# Patient Record
Sex: Female | Born: 1960 | Race: White | Hispanic: No | Marital: Single | Smoking: Former smoker
Health system: Southern US, Community
[De-identification: ages and names within clinical notes are randomized; demographics above are authoritative.]

## PROBLEM LIST (undated history)

## (undated) DIAGNOSIS — K5790 Diverticulosis of intestine, part unspecified, without perforation or abscess without bleeding: Secondary | ICD-10-CM

## (undated) DIAGNOSIS — K649 Unspecified hemorrhoids: Secondary | ICD-10-CM

## (undated) DIAGNOSIS — E785 Hyperlipidemia, unspecified: Secondary | ICD-10-CM

## (undated) DIAGNOSIS — E119 Type 2 diabetes mellitus without complications: Secondary | ICD-10-CM

## (undated) DIAGNOSIS — M1990 Unspecified osteoarthritis, unspecified site: Secondary | ICD-10-CM

## (undated) DIAGNOSIS — E559 Vitamin D deficiency, unspecified: Secondary | ICD-10-CM

## (undated) DIAGNOSIS — K219 Gastro-esophageal reflux disease without esophagitis: Secondary | ICD-10-CM

## (undated) DIAGNOSIS — T7840XA Allergy, unspecified, initial encounter: Secondary | ICD-10-CM

## (undated) HISTORY — DX: Diverticulosis of intestine, part unspecified, without perforation or abscess without bleeding: K57.90

## (undated) HISTORY — DX: Allergy, unspecified, initial encounter: T78.40XA

## (undated) HISTORY — DX: Type 2 diabetes mellitus without complications: E11.9

## (undated) HISTORY — DX: Unspecified osteoarthritis, unspecified site: M19.90

## (undated) HISTORY — DX: Vitamin D deficiency, unspecified: E55.9

## (undated) HISTORY — DX: Gastro-esophageal reflux disease without esophagitis: K21.9

## (undated) HISTORY — PX: NASAL SEPTUM SURGERY: SHX37

## (undated) HISTORY — DX: Unspecified hemorrhoids: K64.9

## (undated) HISTORY — PX: CARPAL TUNNEL RELEASE: SHX101

## (undated) HISTORY — DX: Hyperlipidemia, unspecified: E78.5

---

## 1999-07-07 ENCOUNTER — Other Ambulatory Visit: Admission: RE | Admit: 1999-07-07 | Discharge: 1999-07-07 | Payer: Self-pay | Admitting: Gynecology

## 2000-07-19 ENCOUNTER — Other Ambulatory Visit: Admission: RE | Admit: 2000-07-19 | Discharge: 2000-07-19 | Payer: Self-pay | Admitting: Gynecology

## 2000-11-29 ENCOUNTER — Encounter (INDEPENDENT_AMBULATORY_CARE_PROVIDER_SITE_OTHER): Payer: Self-pay | Admitting: Specialist

## 2000-11-29 ENCOUNTER — Ambulatory Visit (HOSPITAL_BASED_OUTPATIENT_CLINIC_OR_DEPARTMENT_OTHER): Admission: RE | Admit: 2000-11-29 | Discharge: 2000-11-29 | Payer: Self-pay | Admitting: *Deleted

## 2001-07-02 ENCOUNTER — Other Ambulatory Visit: Admission: RE | Admit: 2001-07-02 | Discharge: 2001-07-02 | Payer: Self-pay | Admitting: Gynecology

## 2002-07-09 ENCOUNTER — Other Ambulatory Visit: Admission: RE | Admit: 2002-07-09 | Discharge: 2002-07-09 | Payer: Self-pay | Admitting: Gynecology

## 2003-07-03 ENCOUNTER — Other Ambulatory Visit: Admission: RE | Admit: 2003-07-03 | Discharge: 2003-07-03 | Payer: Self-pay | Admitting: Gynecology

## 2003-08-28 ENCOUNTER — Encounter: Admission: RE | Admit: 2003-08-28 | Discharge: 2003-08-28 | Payer: Self-pay | Admitting: Allergy and Immunology

## 2004-07-04 ENCOUNTER — Other Ambulatory Visit: Admission: RE | Admit: 2004-07-04 | Discharge: 2004-07-04 | Payer: Self-pay | Admitting: Gynecology

## 2005-07-04 ENCOUNTER — Other Ambulatory Visit: Admission: RE | Admit: 2005-07-04 | Discharge: 2005-07-04 | Payer: Self-pay | Admitting: Gynecology

## 2006-06-18 ENCOUNTER — Other Ambulatory Visit: Admission: RE | Admit: 2006-06-18 | Discharge: 2006-06-18 | Payer: Self-pay | Admitting: Gynecology

## 2006-12-18 ENCOUNTER — Ambulatory Visit (HOSPITAL_COMMUNITY): Admission: RE | Admit: 2006-12-18 | Discharge: 2006-12-18 | Payer: Self-pay | Admitting: Internal Medicine

## 2007-07-02 ENCOUNTER — Other Ambulatory Visit: Admission: RE | Admit: 2007-07-02 | Discharge: 2007-07-02 | Payer: Self-pay | Admitting: Internal Medicine

## 2008-02-05 ENCOUNTER — Ambulatory Visit (HOSPITAL_COMMUNITY): Admission: RE | Admit: 2008-02-05 | Discharge: 2008-02-05 | Payer: Self-pay | Admitting: Internal Medicine

## 2008-07-01 ENCOUNTER — Other Ambulatory Visit: Admission: RE | Admit: 2008-07-01 | Discharge: 2008-07-01 | Payer: Self-pay | Admitting: Internal Medicine

## 2009-04-01 ENCOUNTER — Ambulatory Visit (HOSPITAL_COMMUNITY): Admission: RE | Admit: 2009-04-01 | Discharge: 2009-04-01 | Payer: Self-pay | Admitting: Internal Medicine

## 2009-05-04 ENCOUNTER — Observation Stay (HOSPITAL_COMMUNITY): Admission: EM | Admit: 2009-05-04 | Discharge: 2009-05-05 | Payer: Self-pay | Admitting: Emergency Medicine

## 2009-06-07 ENCOUNTER — Encounter: Admission: RE | Admit: 2009-06-07 | Discharge: 2009-06-07 | Payer: Self-pay | Admitting: Orthopedic Surgery

## 2010-11-03 LAB — COMPREHENSIVE METABOLIC PANEL
ALT: 23 U/L (ref 0–35)
AST: 39 U/L — ABNORMAL HIGH (ref 0–37)
Calcium: 9 mg/dL (ref 8.4–10.5)
GFR calc Af Amer: >60 mL/min (ref >60)
Sodium: 136 mEq/L (ref 135–145)
Total Protein: 7 g/dL (ref 6.0–8.3)

## 2010-11-03 LAB — URINALYSIS, ROUTINE W REFLEX MICROSCOPIC
Bilirubin Urine: NEGATIVE
Nitrite: NEGATIVE
Specific Gravity, Urine: 1.028 (ref 1.005–1.030)
pH: 6 (ref 5.0–8.0)

## 2010-11-03 LAB — CULTURE, BLOOD (ROUTINE X 2): Culture: NO GROWTH

## 2010-11-03 LAB — PREGNANCY, URINE: Preg Test, Ur: NEGATIVE

## 2010-11-03 LAB — WOUND CULTURE
Culture: NO GROWTH
Gram Stain: NONE SEEN

## 2010-11-03 LAB — DIFFERENTIAL
Eosinophils Absolute: 0.1 10*3/uL (ref 0.0–0.7)
Eosinophils Relative: 1 % (ref 0–5)
Lymphs Abs: 1.7 10*3/uL (ref 0.7–4.0)
Monocytes Relative: 7 % (ref 3–12)
Neutrophils Relative %: 68 % (ref 43–77)

## 2010-11-03 LAB — CBC
MCHC: 33.7 g/dL (ref 30.0–36.0)
Platelets: 273 10*3/uL (ref 150–400)
RDW: 13.3 % (ref 11.5–15.5)

## 2010-12-16 NOTE — Op Note (Signed)
[place]. [facility]  Patient:    Carla Morton, Carla Morton                           MRN: [id number] Proc. Date: 11/29/00 Adm. Date:  [id number] Attending:  Claudina Lick                           Operative Report  PREOPERATIVE DIAGNOSIS: 1. Traumatic deviated nasal septum. 2. Nasal turbinate hypertrophy.  POSTOPERATIVE DIAGNOSIS: 1. Traumatic deviated nasal septum. 2. Nasal turbinate hypertrophy.  OPERATION PERFORMED: 1. Nasal septoplasty. 2. Submucous resection of right inferior nasal turbinate.  SURGEON:  Robert L. Lyman Bishop, M.D.  ASSISTANT:  General.  ANESTHESIA:  INDICATIONS FOR PROCEDURE:  This 50 year old white female has had a several year history of chronic nasal obstruction, particularly on the left side with a history of previous nasal trauma.  Examination showed a markedly displaced nasal septum to the left with the caudal deflection into the left naris as well.  Compensatory enlargement of the right inferior and right middle turbinates.  CT scan of sinuses within normal limits.  Patient admitted for surgery.  DESCRIPTION OF PROCEDURE:  After satisfactory general endotracheal anesthesia had been induced, topical epinephrine packs were placed intranasally, after which the nasal septum and both inferior nasal turbinates were infiltrated with 1% Xylocaine containing 1:100,000 epinephrine for hemostasis. The nose and face were prepped with Betadine and sterile drapes applied.  There was a marked displacement of the caudal septum into the left naris.  There was then a spur from the right side of the maxillary crest but the bulk of the cartilaginous septum and bony septum was markedly displaced to the left of the maxillary crest and there was enlargement of the right middle and inferior nasal turbinates.  The left anterior septal incision was made and the mucoperichondrium and periosteum elevated on the left side.  Dissection was then  carried around the caudal margin of the septum and the soft tissue flaps were elevated on the right side.  There were multiple fracture lines through the caudal one half of the cartilaginous septum.  These were partially separated.  There was some overlapping of the cartilage and the cartilage was then dissected up from the maxillary crest and the spur was removed from the right side with a chisel.  The cartilage was then partially separated from the bony septum and I found that the perpendicular plate was markedly displaced to the left and there was a broad adhesion between the superior left nasal septum, middle turbinate extending all the way back to the anterior face of the sphenoid.  This was separated with sharp dissection.  The deviated portion of the perpendicular plate was removed.  Part of it I was able to fracture back to the midline.  The posterior septal flaps were then reapproximated with mattress suture of 4-0 plain gut.  The cartilaginous septum was reconstructed by realigning the several pieces of septal cartilage and stabilizing them with sutures placed in the [facility] technique using 4-0 plain gut.  The cartilage was also repositioned and stabilized in the midline over the maxillary crest with mattress sutures of 5-0 Vicryl placed in the [facility] technique.  A pocket was made in the membranous columella and into this was placed the caudal margin of the septum.  The incision was then closed with running 5-0 chromic cut and the caudal margin of the septum was maintained  in the midline with a mattress suture of 4-0 plain gut.  To prevent readhesion on the left side, a large thin piece of Silastic sheeting was sutured to the left side of the nasal septum extending to the root and back to the anterior face of the sphenoid sinus.  A small incision was made over the anterior end of the right inferior nasal turbinate.  The mucoperiosteum elevated.  Excess turbinate and bone  were removed and the incision closed with interrupted 5-0 chromic gut.  The right middle turbinate was crushed and lateralized as was the left anterior nasal turbinated.  A folded strip of Telfa gauze coated with Cortisporin ointment was then placed in each side of the nasal cavity.  Estimated blood loss was 5 to 10 cc.  The patient tolerated the procedure well and was awakened from anesthesia and taken to the recovery room in satisfactory condition. DD:  11/29/00 TD:  11/30/00 Job: [id number]

## 2011-07-01 HISTORY — PX: COLONOSCOPY: SHX174

## 2011-11-05 LAB — HM PAP SMEAR: HM Pap smear: NORMAL

## 2011-11-23 ENCOUNTER — Other Ambulatory Visit: Payer: Self-pay | Admitting: Gynecology

## 2011-11-23 DIAGNOSIS — R928 Other abnormal and inconclusive findings on diagnostic imaging of breast: Secondary | ICD-10-CM

## 2011-11-28 ENCOUNTER — Ambulatory Visit
Admission: RE | Admit: 2011-11-28 | Discharge: 2011-11-28 | Disposition: A | Discharge status: Home / Self Care | Payer: BC Managed Care – PPO | Source: Ambulatory Visit | Attending: Gynecology | Admitting: Gynecology

## 2011-11-28 DIAGNOSIS — R928 Other abnormal and inconclusive findings on diagnostic imaging of breast: Secondary | ICD-10-CM

## 2011-12-21 ENCOUNTER — Encounter (HOSPITAL_COMMUNITY): Payer: Self-pay | Admitting: Family Medicine

## 2011-12-21 ENCOUNTER — Emergency Department (HOSPITAL_COMMUNITY)
Admission: EM | Admit: 2011-12-21 | Discharge: 2011-12-21 | Disposition: A | Discharge status: Home / Self Care | Payer: BC Managed Care – PPO | Attending: Emergency Medicine | Admitting: Emergency Medicine

## 2011-12-21 DIAGNOSIS — J45909 Unspecified asthma, uncomplicated: Secondary | ICD-10-CM | POA: Insufficient documentation

## 2011-12-21 DIAGNOSIS — M79609 Pain in unspecified limb: Secondary | ICD-10-CM | POA: Insufficient documentation

## 2011-12-21 DIAGNOSIS — S61209A Unspecified open wound of unspecified finger without damage to nail, initial encounter: Secondary | ICD-10-CM | POA: Insufficient documentation

## 2011-12-21 DIAGNOSIS — W268XXA Contact with other sharp object(s), not elsewhere classified, initial encounter: Secondary | ICD-10-CM | POA: Insufficient documentation

## 2011-12-21 DIAGNOSIS — Z79899 Other long term (current) drug therapy: Secondary | ICD-10-CM | POA: Insufficient documentation

## 2011-12-21 DIAGNOSIS — R209 Unspecified disturbances of skin sensation: Secondary | ICD-10-CM | POA: Insufficient documentation

## 2011-12-21 MED ORDER — LIDOCAINE HCL 1 % IJ SOLN
5.0000 mL | Freq: Once | INTRAMUSCULAR | Status: AC
  Administered 2011-12-21: 5 mL via INTRADERMAL

## 2011-12-21 MED ORDER — HYDROCODONE-ACETAMINOPHEN 5-325 MG PO TABS
1.0000 | ORAL_TABLET | Freq: Once | ORAL | Status: AC
  Administered 2011-12-21: 1 via ORAL
  Filled 2011-12-21: qty 1

## 2011-12-21 MED ORDER — HYDROCODONE-ACETAMINOPHEN 5-325 MG PO TABS: 1.0000 | ORAL_TABLET | Freq: Four times a day (QID) | ORAL | Status: AC | PRN

## 2011-12-21 MED ORDER — TETANUS-DIPHTH-ACELL PERTUSSIS 5-2.5-18.5 LF-MCG/0.5 IM SUSP
0.5000 mL | Freq: Once | INTRAMUSCULAR | Status: AC
  Administered 2011-12-21: 0.5 mL via INTRAMUSCULAR
  Filled 2011-12-21: qty 0.5

## 2011-12-21 NOTE — ED Provider Notes (Signed)
History     CSN: [id number]  Arrival date & time 12/21/11  Barry Brunner   First MD Initiated Contact with Patient 12/21/11 2023      Chief Complaint  Patient presents with  . Extremity Laceration    (Consider location/radiation/quality/duration/timing/severity/associated sxs/prior treatment) HPI The patient presents immediately after suffering a right fifth digit laceration.  She slipped and slid down her right hand against a metal edge.  Since that time she's had pain persistently throughout the right fifth digit.  She also has active bleeding.  She notes tingling in the distal edge of the extremity.  No other injuries, no other complaints. Past Medical History  Diagnosis Date  . Asthma   . Hypotension     Past Surgical History  Procedure Date  . Nasal septum surgery   . Carpal tunnel release     No family history on file.  History  Substance Use Topics  . Smoking status: Never Smoker   . Smokeless tobacco: Not on file  . Alcohol Use: Yes     Occasional on weekend    OB History    Grav Para Term Preterm Abortions TAB SAB Ect Mult Living                  Review of Systems  All other systems reviewed and are negative.    Allergies  Erythromycin  Home Medications   Current Outpatient Rx  Name Route Sig Dispense Refill  . ESTRADIOL-LEVONORGESTREL 0.045-0.015 MG/DAY TD PTWK Transdermal Place 1 patch onto the skin once a week. Swapped out on Wednesdays.    Marland Kitchen FLUTICASONE PROPIONATE 50 MCG/ACT NA SUSP Nasal Place 2 sprays into the nose daily as needed. For allergies.    Marland Kitchen HYDROCODONE-ACETAMINOPHEN 5-325 MG PO TABS Oral Take 1 tablet by mouth every 6 (six) hours as needed for pain. 15 tablet 0    BP 110/65  Pulse 63  Temp(Src) 97.8 F (36.6 C) (Oral)  Resp 14  SpO2 99%  Physical Exam  Nursing note and vitals reviewed. Constitutional: She is oriented to person, place, and time. She appears well-developed and well-nourished. No distress.  HENT:  Head:  Normocephalic and atraumatic.  Eyes: Conjunctivae and EOM are normal.  Cardiovascular: Intact distal pulses.   Pulmonary/Chest: Effort normal. No stridor. No respiratory distress.  Musculoskeletal:       On the right fifth digit there are 3 distinct areas of laceration.  On the distal edge oriented along the inferior Hale Bogus, running approximately half centimeter below the edge of the nail, there is a 3 cm laceration. On the mid phalanx there is a C. shaped flap approximately 2 cm in circumference with attachment at the proximal edge In the web space along the base of the digit there is a 5 cm full dermal depth laceration with soft tissue visible beneath, including visibility of the nerve. Throughout the range of motion of the digit there is no visible deformity of the tendon, nor of the nerve.  The patient can flex and extend both the medial and distal phalanx.  Refill and sensation are appropriate and the distal pad.  Neurological: She is alert and oriented to person, place, and time. No cranial nerve deficit. She exhibits normal muscle tone. Coordination normal.  Skin: Skin is warm and dry.  Psychiatric: She has a normal mood and affect.    ED Course  LACERATION REPAIR Date/Time: 12/21/2011 11:00 PM Performed by: Gerhard Munch Authorized by: Gerhard Munch Consent: Verbal consent obtained. The procedure was  performed in an emergent situation. Risks and benefits: risks, benefits and alternatives were discussed Consent given by: patient Time out: Immediately prior to procedure a "time out" was called to verify the correct patient, procedure, equipment, support staff and site/side marked as required. Body area: upper extremity Location details: right hand Laceration length: 10 cm Tendon involvement: none Nerve involvement: superficial Vascular damage: no Anesthesia: nerve block Local anesthetic: lidocaine 1% without epinephrine Anesthetic total: 3 ml Patient sedated:  no Preparation: Patient was prepped and draped in the usual sterile fashion. Irrigation solution: saline and tap water Irrigation method: syringe and tap Amount of cleaning: standard Debridement: none Degree of undermining: none Skin closure: 6-0 nylon Number of sutures: 10 Technique: simple Approximation: close Approximation difficulty: complex Dressing: gauze roll, 4x4 sterile gauze and tube gauze Patient tolerance: Patient tolerated the procedure well with no immediate complications.   (including critical care time)  Labs Reviewed - No data to display No results found.   1. Laceration of hand with complication       MDM  This generally well female presents with a right hand laceration.  Laceration includes the web space between the fourth and fifth digits as well as to independent lacerations on the fifth digit.  Although the patient's nerve was visible, she had appropriate neurovascular function in the digit, and her wound was repaired as above.  She was discharged in stable condition to follow up with hand in one week.  She also had her tetanus updated.    Gerhard Munch, MD 12/21/11 [id number]

## 2011-12-21 NOTE — ED Notes (Signed)
Pt dc'd.  [place].  Pt friend at side to drive pt home.  Pt verbalizes understanding

## 2011-12-21 NOTE — ED Notes (Addendum)
Patient states that she fell and while breaking her fall cut her right hand on her hot water heater. Laceration noted between 4th and 5th digit, tip of 5th digit. Unable to touch 5th digit to thumb. Bleeding controlled.

## 2012-09-18 ENCOUNTER — Ambulatory Visit (HOSPITAL_COMMUNITY)
Admission: RE | Admit: 2012-09-18 | Discharge: 2012-09-18 | Disposition: A | Discharge status: Home / Self Care | Payer: BC Managed Care – PPO | Source: Ambulatory Visit | Attending: Physician Assistant | Admitting: Physician Assistant

## 2012-09-18 ENCOUNTER — Other Ambulatory Visit (HOSPITAL_COMMUNITY): Payer: Self-pay | Admitting: Physician Assistant

## 2012-09-18 DIAGNOSIS — I10 Essential (primary) hypertension: Secondary | ICD-10-CM | POA: Insufficient documentation

## 2012-09-18 DIAGNOSIS — Z0000 Encounter for general adult medical examination without abnormal findings: Secondary | ICD-10-CM | POA: Insufficient documentation

## 2012-10-23 ENCOUNTER — Telehealth: Payer: Self-pay | Admitting: Internal Medicine

## 2012-10-23 ENCOUNTER — Encounter: Payer: Self-pay | Admitting: Gastroenterology

## 2012-10-23 ENCOUNTER — Ambulatory Visit (INDEPENDENT_AMBULATORY_CARE_PROVIDER_SITE_OTHER): Payer: BC Managed Care – PPO | Admitting: Gastroenterology

## 2012-10-23 VITALS — BP 98/60 | HR 72 | Temp 99.4°F | Ht 67.0 in | Wt 137.6 lb

## 2012-10-23 DIAGNOSIS — R194 Change in bowel habit: Secondary | ICD-10-CM

## 2012-10-23 DIAGNOSIS — R1030 Lower abdominal pain, unspecified: Secondary | ICD-10-CM

## 2012-10-23 DIAGNOSIS — R109 Unspecified abdominal pain: Secondary | ICD-10-CM

## 2012-10-23 DIAGNOSIS — R102 Pelvic and perineal pain: Secondary | ICD-10-CM | POA: Insufficient documentation

## 2012-10-23 DIAGNOSIS — R198 Other specified symptoms and signs involving the digestive system and abdomen: Secondary | ICD-10-CM

## 2012-10-23 MED ORDER — HYOSCYAMINE SULFATE 0.125 MG SL SUBL: 0.1250 mg | SUBLINGUAL_TABLET | SUBLINGUAL | Status: DC | PRN

## 2012-10-23 MED ORDER — METRONIDAZOLE 500 MG PO TABS: 500.0000 mg | ORAL_TABLET | Freq: Three times a day (TID) | ORAL | Status: DC

## 2012-10-23 NOTE — Telephone Encounter (Signed)
Pt states she has been having abdominal pain and been running a low grade temp. Pt saw her PCP and was placed on antibiotics. States the pain has not gotten any better, describes it as lower abdominal pain right above her bladder. Pt also states her stool has changed to a very small amount and she has urgency all the time. Pt scheduled to see Doug Sou PA today at 2:15pm. Pt aware of appt date and time.

## 2012-10-23 NOTE — Patient Instructions (Addendum)
Your physician has requested that you go to the basement for lab work before leaving today  We have sent the following medications to your pharmacy for you to pick up at your convenience: Flagyl and Levsin  Please call back on Friday and speak with Rene Kocher to give her an update on your symptoms

## 2012-10-23 NOTE — Progress Notes (Signed)
10/23/2012 Carla Morton [id number] 1960-12-13   HISTORY OF PRESENT ILLNESS:  Patient is a pleasant 53 year old female who presents to our office today with complaints of lower abdominal pain, low grade fever, and change of bowels, which began about a week ago.  She says that she ate a burger from "[employer]" that looked like it was undercooked.  She says that shortly after she started developing upper abdominal pain that then moved to her lower abdomen.  It is now located in her suprapubic region and is described as a constant discomfort, but gets more severe and like a stabbing pain, especially at night.  She also says that she has had a low grade fever on three occasions, up to 100.8 degrees.  No nausea or vomiting.  Has had mostly formed stools, but with small amounts of liquid stool at times.  No blood in her stool.  Her appetite is quite normal.  She saw her PCP who ordered a urinalysis and is treating her for a UTI with Cipro (started 5 days ago) and a yeast infection with Diflucan (started 2 days ago).  CBC, CMET, amylase, and lipase were unremarkable.  Says that she is still having symptoms despite the treated that she has already started.  Admits that she was on amoxicillin in January.  She had a colonoscopy in 07/2011 in [place] at which time she was found to have mild diverticulosis in the sigmoid colon, internal hemorrhoids, and hypertrophied anal papillae in the anus.   Never had similar symptoms previously.   Past Medical History  Diagnosis Date  . Asthma     as a child  . Hypotension   . Hyperlipidemia   . Diabetes     pre-diabetic  . Vitamin D deficiency   . Hemorrhoids   . Diverticulosis    Past Surgical History  Procedure Laterality Date  . Nasal septum surgery    . Carpal tunnel release Bilateral     reports that she quit smoking about 37 years ago. Her smoking use included Cigarettes. She has a 8 pack-year smoking history. She has never used smokeless tobacco. She  reports that  drinks alcohol. She reports that she uses illicit drugs (Marijuana). family history includes Colon cancer in her maternal grandmother and Other in her father. Allergies  Allergen Reactions  . Erythromycin       Outpatient Encounter Prescriptions as of 10/23/2012  Medication Sig Dispense Refill  . albuterol (PROVENTIL HFA;VENTOLIN HFA) 108 (90 BASE) MCG/ACT inhaler Inhale 2 puffs into the lungs every 4 (four) hours as needed for wheezing.      . B Complex-C (SUPER B COMPLEX PO) Take 1 tablet by mouth daily.      . Calcium Carbonate-Vitamin D (CALCIUM + D PO) Take 1 tablet by mouth daily.      . Cetirizine HCl (ZYRTEC ALLERGY PO) Take 1 tablet by mouth daily.      . Cholecalciferol (VITAMIN D3) 2000 UNITS TABS Take 1 tablet by mouth daily.      . ciprofloxacin (CIPRO) 500 MG tablet Take 500 mg by mouth 2 (two) times daily.      . Estradiol (CLIMARA TD) Place 1 patch onto the skin once a week.      Kathlee [facility]) 0.045-0.015 MG/DAY Place 1 patch onto the skin once a week. Swapped out on Wednesdays.      . fluconazole (DIFLUCAN) 150 MG tablet Take 150 mg by mouth once a week.       Marland Kitchen  fluticasone (FLONASE) 50 MCG/ACT nasal spray Place 2 sprays into the nose daily as needed. For allergies.      . Multiple Vitamins-Minerals (CENTRUM SILVER PO) Take 1 tablet by mouth daily.      . Turmeric 500 MG CAPS Take 1 tablet by mouth daily.       No facility-administered encounter medications on file as of 10/23/2012.     REVIEW OF SYSTEMS  : All other systems reviewed and negative except where noted in the History of Present Illness.   PHYSICAL EXAM: BP 98/60  Pulse 72  Ht 5\' 7"  (1.702 m)  Wt 137 lb 9 oz (62.398 kg)  BMI 21.54 kg/m2 General: Well developed white female in no acute distress Head: Normocephalic and atraumatic Eyes:  sclerae anicteric,conjunctive pink. Ears: Normal auditory acuity Lungs: Clear throughout to auscultation Heart: Regular rate  and rhythm Abdomen: Soft, non-distended. No masses or hepatomegaly noted. Normal bowel sounds.  Mild suprapubic TTP without R/R/G. Musculoskeletal: Symmetrical with no gross deformities  Skin: No lesions on visible extremities Extremities: No edema  Neurological: Alert oriented x 4, grossly nonfocal Psychological:  Alert and cooperative. Normal mood and affect  ASSESSMENT AND PLAN: -Suprapubic abdominal pain with some change in bowels and low-grade fever that began after eating an undercooked burger.  She is also being treated for a UTI as well with Cipro.  She likely has some type of infectious gastroenteritis.  We will check some stool studies for Cdiff, O&P, WBC, and culture for enteric pathogens.  I will add flagyl to her antibiotic regimen, but asked her to collect stools before starting the flagyl (also told her no ETOH while on flagyl).  Will give her a few Levsin to try as well for the abdominal pain.  She will call the office with an update on her condition on Friday.

## 2012-10-23 NOTE — Progress Notes (Signed)
Assessment and plans reviewed. Agree. Shanda Bumps please make sure that this patient gets followup (have I seen her before?)

## 2012-10-24 ENCOUNTER — Other Ambulatory Visit: Payer: BC Managed Care – PPO

## 2012-10-24 DIAGNOSIS — R1030 Lower abdominal pain, unspecified: Secondary | ICD-10-CM

## 2012-10-24 DIAGNOSIS — R194 Change in bowel habit: Secondary | ICD-10-CM

## 2012-10-28 LAB — STOOL CULTURE

## 2012-11-01 ENCOUNTER — Telehealth: Payer: Self-pay | Admitting: Internal Medicine

## 2012-11-01 NOTE — Telephone Encounter (Signed)
Spoke with Dr. Rhea Belton and if pt took her last dose of Flagyl yesterday it is ok for her to have an alcoholic beverage. Pt aware.

## 2012-11-06 ENCOUNTER — Telehealth: Payer: Self-pay

## 2012-11-06 NOTE — Telephone Encounter (Signed)
Left message for pt to call back  °

## 2012-11-06 NOTE — Telephone Encounter (Signed)
Message copied by Chrystie Nose on Wed Nov 06, 2012  3:17 PM ------      Message from: Doug Sou D      Created: Wed Nov 06, 2012  1:39 PM      Regarding: follow-up visit       Bonita Quin,            Would you please contact this patient and see how she is feeling?  If she is still having issues then please give her an appointment with Dr. Marina Goodell for follow-up.            Thank you,            Jess ------

## 2012-11-07 NOTE — Telephone Encounter (Signed)
Spoke with pt and she states she is doing really good now and does not need to be seen.

## 2012-11-07 NOTE — Telephone Encounter (Signed)
Left message for pt to call back  °

## 2013-07-06 ENCOUNTER — Encounter: Payer: Self-pay | Admitting: Physician Assistant

## 2013-07-08 ENCOUNTER — Ambulatory Visit (INDEPENDENT_AMBULATORY_CARE_PROVIDER_SITE_OTHER): Payer: BC Managed Care – PPO | Admitting: Physician Assistant

## 2013-07-08 ENCOUNTER — Encounter: Payer: Self-pay | Admitting: Physician Assistant

## 2013-07-08 VITALS — BP 102/68 | HR 64 | Temp 98.4°F | Resp 16 | Ht 67.5 in | Wt 137.0 lb

## 2013-07-08 DIAGNOSIS — R7309 Other abnormal glucose: Secondary | ICD-10-CM | POA: Insufficient documentation

## 2013-07-08 DIAGNOSIS — E119 Type 2 diabetes mellitus without complications: Secondary | ICD-10-CM

## 2013-07-08 DIAGNOSIS — E785 Hyperlipidemia, unspecified: Secondary | ICD-10-CM | POA: Insufficient documentation

## 2013-07-08 DIAGNOSIS — R7303 Prediabetes: Secondary | ICD-10-CM | POA: Insufficient documentation

## 2013-07-08 DIAGNOSIS — I10 Essential (primary) hypertension: Secondary | ICD-10-CM

## 2013-07-08 DIAGNOSIS — E559 Vitamin D deficiency, unspecified: Secondary | ICD-10-CM | POA: Insufficient documentation

## 2013-07-08 NOTE — Patient Instructions (Signed)

## 2013-07-08 NOTE — Progress Notes (Signed)
HPI Patient presents for a one month follow up. Patient has hot flashes and was given brintellix which caused diarrhea and did not work. She is now on the estrogen patch and states she is doing well, she is not on baby aspirin. Patient states her neck with radicular symptoms down her right arm has improved with posture and stretches. She also has TMJ and states she has been more aware of that and takes baclofen at night occasionally for that and her sleep.    Past Medical History  Diagnosis Date  . Asthma     as a child  . Hypotension   . Hyperlipidemia   . Diabetes     pre-diabetic  . Vitamin D deficiency   . Hemorrhoids   . Diverticulosis   . Allergy   . GERD (gastroesophageal reflux disease)      Allergies  Allergen Reactions  . Erythromycin   . Singulair [Montelukast Sodium]       Current Outpatient Prescriptions on File Prior to Visit  Medication Sig Dispense Refill  . albuterol (PROVENTIL HFA;VENTOLIN HFA) 108 (90 BASE) MCG/ACT inhaler Inhale 2 puffs into the lungs every 4 (four) hours as needed for wheezing.      . B Complex-C (SUPER B COMPLEX PO) Take 1 tablet by mouth daily.      . Calcium Carbonate-Vitamin D (CALCIUM + D PO) Take 1 tablet by mouth daily.      . Cetirizine HCl (ZYRTEC ALLERGY PO) Take 1 tablet by mouth daily.      . Cholecalciferol (VITAMIN D3) 2000 UNITS TABS Take 1 tablet by mouth daily.      Marland Kitchen estradiol-levonorgestrel (CLIMARAPRO) 0.045-0.015 MG/DAY Place 1 patch onto the skin once a week. Swapped out on Wednesdays.      . fluticasone (FLONASE) 50 MCG/ACT nasal spray Place 2 sprays into the nose daily as needed. For allergies.      . Multiple Vitamins-Minerals (CENTRUM SILVER PO) Take 1 tablet by mouth daily.      . Turmeric 500 MG CAPS Take 1 tablet by mouth daily.       No current facility-administered medications on file prior to visit.    ROS: all negative expect above.   Physical: Filed Weights   07/08/13 0854  Weight: 137 lb (62.143 kg)    Filed Vitals:   07/08/13 0854  BP: 102/68  Pulse: 64  Temp: 98.4 F (36.9 C)  Resp: 16   General Appearance: Well nourished, in no apparent distress. Eyes: PERRLA, EOMs. Sinuses: No Frontal/maxillary tenderness ENT/Mouth: Ext aud canals clear, normal light reflex with TMs without erythema, bulging. Post pharynx without erythema, swelling, exudate.  Respiratory: CTAB Cardio: RRR, no murmurs, rubs or gallops. Peripheral pulses brisk and equal bilaterally, without edema. No aortic or femoral bruits. Abdomen: Flat, soft, with bowl sounds. Nontender, no guarding, rebound. Lymphatics: Non tender without lymphadenopathy.  Musculoskeletal: Full ROM all peripheral extremities, 5/5 strength, and normal gait. Skin: Warm, dry without rashes, lesions, ecchymosis.  Neuro: Cranial nerves intact, reflexes equal bilaterally. Normal muscle tone, no cerebellar symptoms. Sensation intact.  Pysch: Awake and oriented X 3, normal affect, Insight and Judgment appropriate.   Assessment and Plan: Hot flashes- continue the estrogen patch Insomnia- continue the melatonin, can try tylenol PM as well and good sleep hygeine discussed.  Follow up at CPE in Feb

## 2013-07-15 ENCOUNTER — Other Ambulatory Visit: Payer: Self-pay | Admitting: Physician Assistant

## 2013-07-15 MED ORDER — FLUCONAZOLE 150 MG PO TABS: 150.0000 mg | ORAL_TABLET | Freq: Every day | ORAL | Status: DC

## 2013-09-18 ENCOUNTER — Ambulatory Visit (INDEPENDENT_AMBULATORY_CARE_PROVIDER_SITE_OTHER): Payer: BC Managed Care – PPO | Admitting: Physician Assistant

## 2013-09-18 ENCOUNTER — Encounter: Payer: Self-pay | Admitting: Physician Assistant

## 2013-09-18 VITALS — BP 102/62 | HR 64 | Temp 98.4°F | Resp 16 | Ht 67.5 in | Wt 144.0 lb

## 2013-09-18 DIAGNOSIS — E559 Vitamin D deficiency, unspecified: Secondary | ICD-10-CM

## 2013-09-18 DIAGNOSIS — R7989 Other specified abnormal findings of blood chemistry: Secondary | ICD-10-CM

## 2013-09-18 DIAGNOSIS — Z79899 Other long term (current) drug therapy: Secondary | ICD-10-CM

## 2013-09-18 DIAGNOSIS — R945 Abnormal results of liver function studies: Secondary | ICD-10-CM

## 2013-09-18 DIAGNOSIS — Z0000 Encounter for general adult medical examination without abnormal findings: Secondary | ICD-10-CM

## 2013-09-18 DIAGNOSIS — Z1159 Encounter for screening for other viral diseases: Secondary | ICD-10-CM

## 2013-09-18 LAB — LIPID PANEL
CHOL/HDL RATIO: 2.8 ratio
Cholesterol: 199 mg/dL (ref 0–200)
HDL: 71 mg/dL (ref >39)
LDL Cholesterol: 107 mg/dL — ABNORMAL HIGH (ref 0–99)
Triglycerides: 105 mg/dL (ref <150)
VLDL: 21 mg/dL (ref 0–40)

## 2013-09-18 LAB — BASIC METABOLIC PANEL WITH GFR
BUN: 12 mg/dL (ref 6–23)
CHLORIDE: 101 meq/L (ref 96–112)
CO2: 28 meq/L (ref 19–32)
CREATININE: 0.6 mg/dL (ref 0.50–1.10)
Calcium: 9.3 mg/dL (ref 8.4–10.5)
GFR, Est African American: >89 mL/min
Glucose, Bld: 75 mg/dL (ref 70–99)
POTASSIUM: 4.4 meq/L (ref 3.5–5.3)
SODIUM: 138 meq/L (ref 135–145)

## 2013-09-18 LAB — URINALYSIS, ROUTINE W REFLEX MICROSCOPIC
Bilirubin Urine: NEGATIVE
Glucose, UA: NEGATIVE mg/dL
Hgb urine dipstick: NEGATIVE
Ketones, ur: NEGATIVE mg/dL
Leukocytes, UA: NEGATIVE
Nitrite: NEGATIVE
Protein, ur: NEGATIVE mg/dL
SPECIFIC GRAVITY, URINE: 1.006 (ref 1.005–1.030)
UROBILINOGEN UA: 0.2 mg/dL (ref 0.0–1.0)
pH: 6.5 (ref 5.0–8.0)

## 2013-09-18 LAB — CBC WITH DIFFERENTIAL/PLATELET
BASOS ABS: 0.1 10*3/uL (ref 0.0–0.1)
Basophils Relative: 1 % (ref 0–1)
EOS PCT: 1 % (ref 0–5)
Eosinophils Absolute: 0.1 10*3/uL (ref 0.0–0.7)
HCT: 38.4 % (ref 36.0–46.0)
Hemoglobin: 12.9 g/dL (ref 12.0–15.0)
LYMPHS ABS: 1.5 10*3/uL (ref 0.7–4.0)
LYMPHS PCT: 26 % (ref 12–46)
MCH: 30.8 pg (ref 26.0–34.0)
MCHC: 33.6 g/dL (ref 30.0–36.0)
MCV: 91.6 fL (ref 78.0–100.0)
Monocytes Absolute: 0.6 10*3/uL (ref 0.1–1.0)
Monocytes Relative: 10 % (ref 3–12)
NEUTROS ABS: 3.5 10*3/uL (ref 1.7–7.7)
NEUTROS PCT: 62 % (ref 43–77)
PLATELETS: 266 10*3/uL (ref 150–400)
RBC: 4.19 MIL/uL (ref 3.87–5.11)
RDW: 13.3 % (ref 11.5–15.5)
WBC: 5.7 10*3/uL (ref 4.0–10.5)

## 2013-09-18 LAB — HEPATIC FUNCTION PANEL
ALBUMIN: 4.7 g/dL (ref 3.5–5.2)
ALK PHOS: 45 U/L (ref 39–117)
ALT: 24 U/L (ref 0–35)
AST: 28 U/L (ref 0–37)
BILIRUBIN DIRECT: 0.1 mg/dL (ref 0.0–0.3)
BILIRUBIN INDIRECT: 0.4 mg/dL (ref 0.2–1.2)
BILIRUBIN TOTAL: 0.5 mg/dL (ref 0.2–1.2)
Total Protein: 7 g/dL (ref 6.0–8.3)

## 2013-09-18 LAB — IRON AND TIBC
%SAT: 28 % (ref 20–55)
Iron: 100 ug/dL (ref 42–145)
TIBC: 352 ug/dL (ref 250–470)
UIBC: 252 ug/dL (ref 125–400)

## 2013-09-18 LAB — VITAMIN B12: Vitamin B-12: 943 pg/mL — ABNORMAL HIGH (ref 211–911)

## 2013-09-18 LAB — MAGNESIUM: MAGNESIUM: 2 mg/dL (ref 1.5–2.5)

## 2013-09-18 LAB — MICROALBUMIN / CREATININE URINE RATIO
CREATININE, URINE: 21.6 mg/dL
MICROALB/CREAT RATIO: 23.1 mg/g (ref 0.0–30.0)
Microalb, Ur: 0.5 mg/dL (ref 0.00–1.89)

## 2013-09-18 LAB — HEPATITIS B SURFACE ANTIBODY,QUALITATIVE: Hep B S Ab: NEGATIVE

## 2013-09-18 LAB — HEMOGLOBIN A1C
HEMOGLOBIN A1C: 5.6 % (ref <5.7)
Mean Plasma Glucose: 114 mg/dL (ref <117)

## 2013-09-18 LAB — HIV ANTIBODY (ROUTINE TESTING W REFLEX): HIV: NONREACTIVE

## 2013-09-18 LAB — HEPATITIS C ANTIBODY: HCV AB: NEGATIVE

## 2013-09-18 LAB — HEPATITIS A ANTIBODY, TOTAL: Hep A Total Ab: NONREACTIVE

## 2013-09-18 LAB — RPR

## 2013-09-18 LAB — FERRITIN: Ferritin: 35 ng/mL (ref 10–291)

## 2013-09-18 LAB — HEPATITIS B CORE ANTIBODY, TOTAL: Hep B Core Total Ab: NONREACTIVE

## 2013-09-18 LAB — TSH: TSH: 0.965 u[IU]/mL (ref 0.350–4.500)

## 2013-09-18 NOTE — Patient Instructions (Signed)
Preventative Care for Adults - Female      MAINTAIN REGULAR HEALTH EXAMS:  A routine yearly physical is a good way to check in with your primary care provider about your health and preventive screening. It is also an opportunity to share updates about your health and any concerns you have, and receive a thorough all-over exam.   Most health insurance companies pay for at least some preventative services.  Check with your health plan for specific coverages.  WHAT PREVENTATIVE SERVICES DO WOMEN NEED?  Adult women should have their weight and blood pressure checked regularly.   Women age 35 and older should have their cholesterol levels checked regularly.  Women should be screened for cervical cancer with a Pap smear and pelvic exam beginning at either age 21, or 3 years after they become sexually activity.    Breast cancer screening generally begins at age 40 with a mammogram and breast exam by your primary care provider.    Beginning at age 50 and continuing to age 75, women should be screened for colorectal cancer.  Certain people may need continued testing until age 85.  Updating vaccinations is part of preventative care.  Vaccinations help protect against diseases such as the flu.  Osteoporosis is a disease in which the bones lose minerals and strength as we age. Women ages 65 and over should discuss this with their caregivers, as should women after menopause who have other risk factors.  Lab tests are generally done as part of preventative care to screen for anemia and blood disorders, to screen for problems with the kidneys and liver, to screen for bladder problems, to check blood sugar, and to check your cholesterol level.  Preventative services generally include counseling about diet, exercise, avoiding tobacco, drugs, excessive alcohol consumption, and sexually transmitted infections.    GENERAL RECOMMENDATIONS FOR GOOD HEALTH:  Healthy diet:  Eat a variety of foods, including  fruit, vegetables, animal or vegetable protein, such as meat, fish, chicken, and eggs, or beans, lentils, tofu, and grains, such as rice.  Drink plenty of water daily.  Decrease saturated fat in the diet, avoid lots of red meat, processed foods, sweets, fast foods, and fried foods.  Exercise:  Aerobic exercise helps maintain good heart health. At least 30-40 minutes of moderate-intensity exercise is recommended. For example, a brisk walk that increases your heart rate and breathing. This should be done on most days of the week.   Find a type of exercise or a variety of exercises that you enjoy so that it becomes a part of your daily life.  Examples are running, walking, swimming, water aerobics, and biking.  For motivation and support, explore group exercise such as aerobic class, spin class, Zumba, Yoga,or  martial arts, etc.    Set exercise goals for yourself, such as a certain weight goal, walk or run in a race such as a 5k walk/run.  Speak to your primary care provider about exercise goals.  Disease prevention:  If you smoke or chew tobacco, find out from your caregiver how to quit. It can literally save your life, no matter how long you have been a tobacco user. If you do not use tobacco, never begin.   Maintain a healthy diet and normal weight. Increased weight leads to problems with blood pressure and diabetes.   The Body Mass Index or BMI is a way of measuring how much of your body is fat. Having a BMI above 27 increases the risk of heart disease,   diabetes, hypertension, stroke and other problems related to obesity. Your caregiver can help determine your BMI and based on it develop an exercise and dietary program to help you achieve or maintain this important measurement at a healthful level.  High blood pressure causes heart and blood vessel problems.  Persistent high blood pressure should be treated with medicine if weight loss and exercise do not work.   Fat and cholesterol leaves  deposits in your arteries that can block them. This causes heart disease and vessel disease elsewhere in your body.  If your cholesterol is found to be high, or if you have heart disease or certain other medical conditions, then you may need to have your cholesterol monitored frequently and be treated with medication.   Ask if you should have a cardiac stress test if your history suggests this. A stress test is a test done on a treadmill that looks for heart disease. This test can find disease prior to there being a problem.  Menopause can be associated with physical symptoms and risks. Hormone replacement therapy is available to decrease these. You should talk to your caregiver about whether starting or continuing to take hormones is right for you.   Osteoporosis is a disease in which the bones lose minerals and strength as we age. This can result in serious bone fractures. Risk of osteoporosis can be identified using a bone density scan. Women ages 65 and over should discuss this with their caregivers, as should women after menopause who have other risk factors. Ask your caregiver whether you should be taking a calcium supplement and Vitamin D, to reduce the rate of osteoporosis.   Avoid drinking alcohol in excess (more than two drinks per day).  Avoid use of street drugs. Do not share needles with anyone. Ask for professional help if you need assistance or instructions on stopping the use of alcohol, cigarettes, and/or drugs.  Brush your teeth twice a day with fluoride toothpaste, and floss once a day. Good oral hygiene prevents tooth decay and gum disease. The problems can be painful, unattractive, and can cause other health problems. Visit your dentist for a routine oral and dental check up and preventive care every 6-12 months.   Look at your skin regularly.  Use a mirror to look at your back. Notify your caregivers of changes in moles, especially if there are changes in shapes, colors, a size  larger than a pencil eraser, an irregular border, or development of new moles.  Safety:  Use seatbelts 100% of the time, whether driving or as a passenger.  Use safety devices such as hearing protection if you work in environments with loud noise or significant background noise.  Use safety glasses when doing any work that could send debris in to the eyes.  Use a helmet if you ride a bike or motorcycle.  Use appropriate safety gear for contact sports.  Talk to your caregiver about gun safety.  Use sunscreen with a SPF (or skin protection factor) of 15 or greater.  Lighter skinned people are at a greater risk of skin cancer. Don't forget to also wear sunglasses in order to protect your eyes from too much damaging sunlight. Damaging sunlight can accelerate cataract formation.   Practice safe sex. Use condoms. Condoms are used for birth control and to help reduce the spread of sexually transmitted infections (or STIs).  Some of the STIs are gonorrhea (the clap), chlamydia, syphilis, trichomonas, herpes, HPV (human papilloma virus) and HIV (human immunodeficiency virus)   which causes AIDS. The herpes, HIV and HPV are viral illnesses that have no cure. These can result in disability, cancer and death.   Keep carbon monoxide and smoke detectors in your home functioning at all times. Change the batteries every 6 months or use a model that plugs into the wall.   Vaccinations:  Stay up to date with your tetanus shots and other required immunizations. You should have a booster for tetanus every 10 years. Be sure to get your flu shot every year, since 5%-20% of the [place] population comes down with the flu. The flu vaccine changes each year, so being vaccinated once is not enough. Get your shot in the fall, before the flu season peaks.   Other vaccines to consider:  Human Papilloma Virus or HPV causes cancer of the cervix, and other infections that can be transmitted from person to person. There is a vaccine for  HPV, and females should get immunized between the ages of 4111 and 7526. It requires a series of 3 shots.   Pneumococcal vaccine to protect against certain types of pneumonia.  This is normally recommended for adults age 54 or older.  However, adults younger than 54 years old with certain underlying conditions such as diabetes, heart or lung disease should also receive the vaccine.  Shingles vaccine to protect against Varicella Zoster if you are older than age 54, or younger than 54 years old with certain underlying illness.  Hepatitis A vaccine to protect against a form of infection of the liver by a virus acquired from food.  Hepatitis B vaccine to protect against a form of infection of the liver by a virus acquired from blood or body fluids, particularly if you work in health care.  If you plan to travel internationally, check with your local health department for specific vaccination recommendations.  Cancer Screening:  Breast cancer screening is essential to preventive care for women. All women age 54 and older should perform a breast self-exam every month. At age 54 and older, women should have their caregiver complete a breast exam each year. Women at ages 6540 and older should have a mammogram (x-ray film) of the breasts. Your caregiver can discuss how often you need mammograms.    Cervical cancer screening includes taking a Pap smear (sample of cells examined under a microscope) from the cervix (end of the uterus). It also includes testing for HPV (Human Papilloma Virus, which can cause cervical cancer). Screening and a pelvic exam should begin at age 54, or 3 years after a woman becomes sexually active. Screening should occur every year, with a Pap smear but no HPV testing, up to age 54. After age 54, you should have a Pap smear every 3 years with HPV testing, if no HPV was found previously.   Most routine colon cancer screening begins at the age of 54. On a yearly basis, doctors may provide  special easy to use take-home tests to check for hidden blood in the stool. Sigmoidoscopy or colonoscopy can detect the earliest forms of colon cancer and is life saving. These tests use a small camera at the end of a tube to directly examine the colon. Speak to your caregiver about this at age 54, when routine screening begins (and is repeated every 5 years unless early forms of pre-cancerous polyps or small growths are found).    Candida Infection, Adult A candida infection (also called yeast, fungus and Monilia infection) is an overgrowth of yeast that can occur anywhere  on the body. A yeast infection commonly occurs in warm, moist body areas. Usually, the infection remains localized but can spread to become a systemic infection. A yeast infection may be a sign of a more severe disease such as diabetes, leukemia, or AIDS. A yeast infection can occur in both men and women. In women, Candida vaginitis is a vaginal infection. It is one of the most common causes of vaginitis. Men usually do not have symptoms or know they have an infection until other problems develop. Men may find out they have a yeast infection because their sex partner has a yeast infection. Uncircumcised men are more likely to get a yeast infection than circumcised men. This is because the uncircumcised glans is not exposed to air and does not remain as dry as that of a circumcised glans. Older adults may develop yeast infections around dentures. CAUSES  Women  Antibiotics.  Steroid medication taken for a long time.  Being overweight (obese).  Diabetes.  Poor immune condition.  Certain serious medical conditions.  Immune suppressive medications for organ transplant patients.  Chemotherapy.  Pregnancy.  Menstration.  Stress and fatigue.  Intravenous drug use.  Oral contraceptives.  Wearing tight-fitting clothes in the crotch area.  Catching it from a sex partner who has a yeast  infection.  Spermicide.  Intravenous, urinary, or other catheters. Men  Catching it from a sex partner who has a yeast infection.  Having oral or anal sex with a person who has the infection.  Spermicide.  Diabetes.  Antibiotics.  Poor immune system.  Medications that suppress the immune system.  Intravenous drug use.  Intravenous, urinary, or other catheters. SYMPTOMS  Women  Thick, white vaginal discharge.  Vaginal itching.  Redness and swelling in and around the vagina.  Irritation of the lips of the vagina and perineum.  Blisters on the vaginal lips and perineum.  Painful sexual intercourse.  Low blood sugar (hypoglycemia).  Painful urination.  Bladder infections.  Intestinal problems such as constipation, indigestion, bad breath, bloating, increase in gas, diarrhea, or loose stools. Men  Men may develop intestinal problems such as constipation, indigestion, bad breath, bloating, increase in gas, diarrhea, or loose stools.  Dry, cracked skin on the penis with itching or discomfort.  Jock itch.  Dry, flaky skin.  Athlete's foot.  Hypoglycemia. DIAGNOSIS  Women  A history and an exam are performed.  The discharge may be examined under a microscope.  A culture may be taken of the discharge. Men  A history and an exam are performed.  Any discharge from the penis or areas of cracked skin will be looked at under the microscope and cultured.  Stool samples may be cultured. TREATMENT  Women  Vaginal antifungal suppositories and creams.  Medicated creams to decrease irritation and itching on the outside of the vagina.  Warm compresses to the perineal area to decrease swelling and discomfort.  Oral antifungal medications.  Medicated vaginal suppositories or cream for repeated or recurrent infections.  Wash and dry the irritation areas before applying the cream.  Eating yogurt with lactobacillus may help with prevention and  treatment.  Sometimes painting the vagina with gentian violet solution may help if creams and suppositories do not work. Men  Antifungal creams and oral antifungal medications.  Sometimes treatment must continue for 30 days after the symptoms go away to prevent recurrence. HOME CARE INSTRUCTIONS  Women  Use cotton underwear and avoid tight-fitting clothing.  Avoid colored, scented toilet paper and deodorant tampons or  pads.  Do not douche.  Keep your diabetes under control.  Finish all the prescribed medications.  Keep your skin clean and dry.  Consume milk or yogurt with lactobacillus active culture regularly. If you get frequent yeast infections and think that is what the infection is, there are over-the-counter medications that you can get. If the infection does not show healing in 3 days, talk to your caregiver.  Tell your sex partner you have a yeast infection. Your partner may need treatment also, especially if your infection does not clear up or recurs. Men  Keep your skin clean and dry.  Keep your diabetes under control.  Finish all prescribed medications.  Tell your sex partner that you have a yeast infection so they can be treated if necessary. SEEK MEDICAL CARE IF:   Your symptoms do not clear up or worsen in one week after treatment.  You have an oral temperature above 102 F (38.9 C).  You have trouble swallowing or eating for a prolonged time.  You develop blisters on and around your vagina.  You develop vaginal bleeding and it is not your menstrual period.  You develop abdominal pain.  You develop intestinal problems as mentioned above.  You get weak or lightheaded.  You have painful or increased urination.  You have pain during sexual intercourse. MAKE SURE YOU:   Understand these instructions.  Will watch your condition.  Will get help right away if you are not doing well or get worse. Document Released: 08/24/2004 Document Revised:  10/09/2011 Document Reviewed: 12/06/2009 [facility] Patient Information 2014 [place], [place].

## 2013-09-18 NOTE — Progress Notes (Signed)
Complete Physical HPI 53 y.o. female  presents for a complete physical. Her blood pressure has been controlled at home, today their BP is BP: 102/62 mmHg She denies chest pain, shortness of breath, dizziness.  Her cholesterol is diet controlled.  Her cholesterol is controlled. The cholesterol last visit was: LDL 92 She has been working on diet and exercise for prediabetes, and denies blurry vision, polydipsia, polyphagia and polyuria. Last A1C in the office was: 5.7 Patient is on Vitamin D supplement.  Continues to have a problem with yeast infections, she thinks she has a yeast infection in her mouth currently and has recurrent vaginal yeast infections. Patient believes that the estrogen patch may be contributing to the yeast, they started with the estrogen patch.  Current Medications:  Current Outpatient Prescriptions on File Prior to Visit  Medication Sig Dispense Refill  . albuterol (PROVENTIL HFA;VENTOLIN HFA) 108 (90 BASE) MCG/ACT inhaler Inhale 2 puffs into the lungs every 4 (four) hours as needed for wheezing.      . B Complex-C (SUPER B COMPLEX PO) Take 1 tablet by mouth daily.      . Calcium Carbonate-Vitamin D (CALCIUM + D PO) Take 1 tablet by mouth daily.      . Cetirizine HCl (ZYRTEC ALLERGY PO) Take 1 tablet by mouth daily.      . Cholecalciferol (VITAMIN D3) 2000 UNITS TABS Take 1 tablet by mouth daily.      Marland Kitchen. estradiol-levonorgestrel (CLIMARAPRO) 0.045-0.015 MG/DAY Place 1 patch onto the skin once a week. Swapped out on Wednesdays.      . fluticasone (FLONASE) 50 MCG/ACT nasal spray Place 2 sprays into the nose daily as needed. For allergies.      . Multiple Vitamins-Minerals (CENTRUM SILVER PO) Take 1 tablet by mouth daily.      . Turmeric 500 MG CAPS Take 1 tablet by mouth daily.       No current facility-administered medications on file prior to visit.   Health Maintenance:  Tetanus: 2013 Pneumovax: 2012 Flu vaccine: 04/2013  Zostavax:N/A Pap: April 2014 Dr.  Chevis PrettyMezer MGM: April 2014  DEXA: 10/2011 Colonoscopy: 07/2011 due 2014 Dr. Tanna Savoyamsay [facility] EGD: N/A  Allergies:  Allergies  Allergen Reactions  . Erythromycin   . Singulair [Montelukast Sodium]    Medical History:  Past Medical History  Diagnosis Date  . Asthma     as a child  . Hypotension   . Hyperlipidemia   . Diabetes     pre-diabetic  . Vitamin D deficiency   . Hemorrhoids   . Diverticulosis   . Allergy   . GERD (gastroesophageal reflux disease)    Surgical History:  Past Surgical History  Procedure Laterality Date  . Nasal septum surgery    . Carpal tunnel release Bilateral    Family History:  Family History  Problem Relation Age of Onset  . Other Father     colon resection  . Cancer Father [id number]    colon  . Colon cancer Maternal Grandmother    Social History:  History   Social History  . Marital Status: Single    Spouse Name: N/A    Number of Children: 0  . Years of Education: N/A   Occupational History  . electrician    Social History Main Topics  . Smoking status: Former Smoker -- 1.00 packs/day for 8 years    Types: Cigarettes    Quit date: 08/01/1975  . Smokeless tobacco: Never Used  . Alcohol Use: Yes  Comment: Occasional on weekend  . Drug Use: Yes    Special: Marijuana  . Sexual Activity: Yes   Other Topics Concern  . Not on file   Social History Narrative  . No narrative on file   ROS Constitutional: Denies weight loss/gain, headaches, insomnia, fatigue, night sweats, and change in appetite. Eyes: (Dr. Luetta Nutting) DEE 2 years, wears glasses. Denies redness, blurred vision, diplopia, discharge, itchy, watery eyes.  ENT: + yeast mouth Denies discharge, congestion, post nasal drip, sore throat, earache, hearing loss, dental pain, Tinnitus, Vertigo, Sinus pain, snoring.  Cardio: Denies chest pain, palpitations, irregular heartbeat, dyspnea, diaphoresis, orthopnea, PND, claudication, edema Respiratory: denies cough,  dyspnea, pleurisy, hoarseness, wheezing.  Gastrointestinal: (Dr. Tanna Savoy) Denies dysphagia, heartburn, pain, cramps, nausea, vomiting, bloating, diarrhea, constipation, hematemesis, melena, hematochezia, hemorrhoids Genitourinary: (Dr. Chevis Pretty) + yeast infections Denies dysuria, frequency, urgency, nocturia, hesitancy, discharge, hematuria, flank pain Breast: Denies Breast lumps, nipple discharge, bleeding.  Musculoskeletal: (Dr. Margo Common)  Denies arthralgia, myalgia, stiffness, Jt. Swelling, pain, Skin: Denies pruritis, rash, hives,  acne, eczema, changing in skin lesion Neuro: Denies Weakness, tremor, incoordination, spasms, paresthesia, pain Psychiatric: Denies confusion, memory loss, sensory loss Endocrine: Denies change in weight, skin, hair change, nocturia, and paresthesia, Diabetic Denies Polys, visual blurring, hyper /hypo glycemic episodes.  Heme/Lymph: Denies Excessive bleeding, bruising, enlarged lymph nodes  Physical Exam: Estimated body mass index is 22.21 kg/(m^2) as calculated from the following:   Height as of this encounter: 5' 7.5" (1.715 m).   Weight as of this encounter: 144 lb (65.318 kg). Filed Vitals:   09/18/13 0858  BP: 102/62  Pulse: 64  Temp: 98.4 F (36.9 C)  Resp: 16   General Appearance: Well nourished, in no apparent distress. Eyes: PERRLA, EOMs, conjunctiva no swelling or erythema, normal fundi and vessels. Sinuses: No Frontal/maxillary tenderness ENT/Mouth: Ext aud canals clear, normal light reflex with TMs without erythema, bulging.  Good dentition. No erythema, swelling, or exudate on post pharynx. Tonsils not swollen or erythematous. Hearing normal.  Neck: Supple, thyroid normal. No bruits Respiratory: Respiratory effort normal, BS equal bilaterally without rales, rhonchi, wheezing or stridor. Cardio: RRR without murmurs, rubs or gallops. Brisk peripheral pulses without edema.  Chest: symmetric, with normal excursions and percussion. Breasts:  defer Abdomen: Soft, +BS. Non tender, no guarding, rebound, hernias, masses, or organomegaly. .  Lymphatics: Non tender without lymphadenopathy.  Genitourinary: defer Musculoskeletal: Full ROM all peripheral extremities,5/5 strength, and normal gait. Skin: Warm, dry without rashes, lesions, ecchymosis.  Neuro: Cranial nerves intact, reflexes equal bilaterally. Normal muscle tone, no cerebellar symptoms. Sensation intact.  Psych: Awake and oriented X 3, normal affect, Insight and Judgment appropriate.   EKG: WNL no changes.  Assessment and Plan: Asthma- controlled  Hypotension- controlled  Hyperlipidemia- check lipids  Prediabetes- check A1C  Vitamin D deficiency- check Vitamin d  Hemorrhoids- remission  Diverticulosis- no flares  Allergy- controlled  GERD (gastroesophageal reflux disease)- controlled  Health Maintenance  Discussed med's effects and SE's. Screening labs and tests as requested with regular follow-up as recommended.   Quentin Mulling 9:20 AM

## 2013-09-19 LAB — VITAMIN D 25 HYDROXY (VIT D DEFICIENCY, FRACTURES): Vit D, 25-Hydroxy: 73 ng/mL (ref 30–89)

## 2013-09-19 LAB — INSULIN, FASTING: Insulin fasting, serum: 5 u[IU]/mL (ref 3–28)

## 2013-09-22 LAB — HEPATITIS B E ANTIBODY: HEPATITIS BE ANTIBODY: NEGATIVE

## 2013-12-05 ENCOUNTER — Other Ambulatory Visit: Payer: Self-pay | Admitting: Gynecology

## 2013-12-05 DIAGNOSIS — R928 Other abnormal and inconclusive findings on diagnostic imaging of breast: Secondary | ICD-10-CM

## 2013-12-17 ENCOUNTER — Other Ambulatory Visit: Payer: BC Managed Care – PPO

## 2013-12-29 ENCOUNTER — Ambulatory Visit
Admission: RE | Admit: 2013-12-29 | Discharge: 2013-12-29 | Disposition: A | Discharge status: Home / Self Care | Payer: BC Managed Care – PPO | Source: Ambulatory Visit | Attending: Gynecology | Admitting: Gynecology

## 2013-12-29 DIAGNOSIS — R928 Other abnormal and inconclusive findings on diagnostic imaging of breast: Secondary | ICD-10-CM

## 2014-03-19 ENCOUNTER — Encounter: Payer: Self-pay | Admitting: Physician Assistant

## 2014-03-19 ENCOUNTER — Ambulatory Visit (INDEPENDENT_AMBULATORY_CARE_PROVIDER_SITE_OTHER): Payer: BC Managed Care – PPO | Admitting: Physician Assistant

## 2014-03-19 VITALS — BP 104/56 | HR 78 | Temp 98.6°F | Resp 18 | Ht 67.5 in | Wt 137.0 lb

## 2014-03-19 DIAGNOSIS — R7303 Prediabetes: Secondary | ICD-10-CM

## 2014-03-19 DIAGNOSIS — Z79899 Other long term (current) drug therapy: Secondary | ICD-10-CM

## 2014-03-19 DIAGNOSIS — R7309 Other abnormal glucose: Secondary | ICD-10-CM

## 2014-03-19 DIAGNOSIS — E559 Vitamin D deficiency, unspecified: Secondary | ICD-10-CM

## 2014-03-19 DIAGNOSIS — E785 Hyperlipidemia, unspecified: Secondary | ICD-10-CM

## 2014-03-19 LAB — CBC WITH DIFFERENTIAL/PLATELET
Basophils Absolute: 0 10*3/uL (ref 0.0–0.1)
Basophils Relative: 0 % (ref 0–1)
Eosinophils Absolute: 0.1 10*3/uL (ref 0.0–0.7)
Eosinophils Relative: 1 % (ref 0–5)
HEMATOCRIT: 35.3 % — AB (ref 36.0–46.0)
HEMOGLOBIN: 11.8 g/dL — AB (ref 12.0–15.0)
LYMPHS ABS: 2.1 10*3/uL (ref 0.7–4.0)
Lymphocytes Relative: 38 % (ref 12–46)
MCH: 31.1 pg (ref 26.0–34.0)
MCHC: 33.4 g/dL (ref 30.0–36.0)
MCV: 92.9 fL (ref 78.0–100.0)
MONO ABS: 0.5 10*3/uL (ref 0.1–1.0)
MONOS PCT: 9 % (ref 3–12)
NEUTROS PCT: 52 % (ref 43–77)
Neutro Abs: 2.9 10*3/uL (ref 1.7–7.7)
Platelets: 247 10*3/uL (ref 150–400)
RBC: 3.8 MIL/uL — AB (ref 3.87–5.11)
RDW: 13.3 % (ref 11.5–15.5)
WBC: 5.6 10*3/uL (ref 4.0–10.5)

## 2014-03-19 MED ORDER — FLUTICASONE PROPIONATE 50 MCG/ACT NA SUSP: 2.0000 | Freq: Every day | NASAL | Status: DC | PRN

## 2014-03-19 NOTE — Progress Notes (Signed)
Assessment and Plan:  Hypertension: Continue medication, monitor blood pressure at home. Continue DASH diet. Cholesterol: Continue diet and exercise. Check cholesterol.  Pre-diabetes-Continue diet and exercise. Check A1C Vitamin D Def- check level and continue medications.  GERD?- will give nexium samples Recurrent yeast- can do boric acid supp from [place] [facility]  Continue diet and meds as discussed. Further disposition pending results of labs.  HPI 53 y.o. female  presents for 3 month follow up with hypertension, hyperlipidemia, prediabetes and vitamin D. Her blood pressure has been controlled at home, today their BP is BP: 104/56 mmHg She does not workout but her job is very physical and she is very active at home. She denies chest pain, shortness of breath, dizziness.  She is not on cholesterol medication and denies myalgias. Her cholesterol is at goal. The cholesterol last visit was:   Lab Results  Component Value Date   CHOL 199 09/18/2013   HDL 71 09/18/2013   LDLCALC 107* 09/18/2013   TRIG 105 09/18/2013   CHOLHDL 2.8 09/18/2013   She has been working on diet and exercise for prediabetes, and denies polydipsia, polyuria and visual disturbances. Last A1C in the office was:  Lab Results  Component Value Date   HGBA1C 5.6 09/18/2013   Patient is on Vitamin D supplement.   Lab Results  Component Value Date   VD25OH 73 09/18/2013       Current Medications:  Current Outpatient Prescriptions on File Prior to Visit  Medication Sig Dispense Refill  . albuterol (PROVENTIL HFA;VENTOLIN HFA) 108 (90 BASE) MCG/ACT inhaler Inhale 2 puffs into the lungs every 4 (four) hours as needed for wheezing.      . B Complex-C (SUPER B COMPLEX PO) Take 1 tablet by mouth daily.      . Calcium Carbonate-Vitamin D (CALCIUM + D PO) Take 1 tablet by mouth daily.      . Cetirizine HCl (ZYRTEC ALLERGY PO) Take 1 tablet by mouth daily.      . Cholecalciferol (VITAMIN D3) 2000 UNITS TABS Take 1 tablet by  mouth daily.      Marland Kitchen. estradiol-levonorgestrel (CLIMARAPRO) 0.045-0.015 MG/DAY Place 1 patch onto the skin once a week. Swapped out on Wednesdays.      . fluticasone (FLONASE) 50 MCG/ACT nasal spray Place 2 sprays into the nose daily as needed. For allergies.      . Multiple Vitamins-Minerals (CENTRUM SILVER PO) Take 1 tablet by mouth daily.      . Turmeric 500 MG CAPS Take 1 tablet by mouth daily.       No current facility-administered medications on file prior to visit.   Medical History:  Past Medical History  Diagnosis Date  . Asthma     as a child  . Hypotension   . Hyperlipidemia   . Diabetes     pre-diabetic  . Vitamin D deficiency   . Hemorrhoids   . Diverticulosis   . Allergy   . GERD (gastroesophageal reflux disease)    Allergies:  Allergies  Allergen Reactions  . Erythromycin   . Singulair [Montelukast Sodium]      Review of Systems: [X]  = complains of  [ ]  = denies  General: Fatigue [ ]  Fever [ ]  Chills [ ]  Weakness [ ]   Insomnia [ ]  Eyes: Redness [ ]  Blurred vision [ ]  Diplopia [ ]   ENT: Congestion [ ]  Sinus Pain [ ]  Post Nasal Drip [ ]  Sore Throat [ ]  Earache [ ]   Cardiac: Chest  pain/pressure [ ]  SOB [ ]  Orthopnea [ ]   Palpitations [ ]   Paroxysmal nocturnal dyspnea[ ]  Claudication [ ]  Edema [ ]   Pulmonary: Cough [ ]  Wheezing[ ]   SOB [ ]   Snoring [ ]   GI: Nausea [ ]  Vomiting[ ]  Dysphagia[ ]  Heartburn[ ]  Abdominal pain [ ]  Constipation [ ] ; Diarrhea [ ] ; BRBPR [ ]  Melena[ ]  GU: Hematuria[ ]  Dysuria [ ]  Nocturia[ ]  Urgency [ ]   Hesitancy [ ]  Discharge [ ]  Neuro: Headaches[ ]  Vertigo[ ]  Paresthesias[ ]  Spasm [ ]  Speech changes [ ]  Incoordination [ ]   Ortho: Arthritis [ ]  Joint pain [ ]  Muscle pain [ ]  Joint swelling [ ]  Back Pain [ ]  Skin:  Rash [ ]   Pruritis [ ]  Change in skin lesion [ ]   Psych: Depression[ ]  Anxiety[ ]  Confusion [ ]  Memory loss [ ]   Heme/Lypmh: Bleeding [ ]  Bruising [ ]  Enlarged lymph nodes [ ]   Endocrine: Visual blurring [ ]  Paresthesia [ ]   Polyuria [ ]  Polydypsea [ ]    Heat/cold intolerance [ ]  Hypoglycemia [ ]   Family history- Review and unchanged Social history- Review and unchanged Physical Exam: BP 104/56  Pulse 78  Temp(Src) 98.6 F (37 C) (Temporal)  Resp 18  Ht 5' 7.5" (1.715 m)  Wt 137 lb (62.143 kg)  BMI 21.13 kg/m2 Wt Readings from Last 3 Encounters:  03/19/14 137 lb (62.143 kg)  09/18/13 144 lb (65.318 kg)  07/08/13 137 lb (62.143 kg)   General Appearance: Well nourished, in no apparent distress. Eyes: PERRLA, EOMs, conjunctiva no swelling or erythema Sinuses: No Frontal/maxillary tenderness ENT/Mouth: Ext aud canals clear, TMs without erythema, bulging. No erythema, swelling, or exudate on post pharynx.  Tonsils not swollen or erythematous. Hearing normal.  Neck: Supple, thyroid normal.  Respiratory: Respiratory effort normal, BS equal bilaterally without rales, rhonchi, wheezing or stridor.  Cardio: RRR with no MRGs. Brisk peripheral pulses without edema.  Abdomen: Soft, + BS.  Non tender, no guarding, rebound, hernias, masses. Lymphatics: Non tender without lymphadenopathy.  Musculoskeletal: Full ROM, 5/5 strength, normal gait.  Skin: Warm, dry without rashes, lesions, ecchymosis.  Neuro: Cranial nerves intact. Normal muscle tone, no cerebellar symptoms. Sensation intact.  Psych: Awake and oriented X 3, normal affect, Insight and Judgment appropriate.    Quentin Mulling 3:17 PM

## 2014-03-19 NOTE — Patient Instructions (Signed)
Take the nexium/protonix for 2-4 more weeks once a day, then switch to pepcid or zantac (generic is fine) 2 x a day for 2 weeks, then once a day for 2 weeks and then stop. Avoid alcohol, spicy foods, NSAIDS (aleve, ibuprofen) at this time. See foods below.   Food Choices for Gastroesophageal Reflux Disease When you have gastroesophageal reflux disease (GERD), the foods you eat and your eating habits are very important. Choosing the right foods can help ease the discomfort of GERD. WHAT GENERAL GUIDELINES DO I NEED TO FOLLOW?  Choose fruits, vegetables, whole grains, low-fat dairy products, and low-fat meat, fish, and poultry.  Limit fats such as oils, salad dressings, butter, nuts, and avocado.  Keep a food diary to identify foods that cause symptoms.  Avoid foods that cause reflux. These may be different for different people.  Eat frequent small meals instead of three large meals each day.  Eat your meals slowly, in a relaxed setting.  Limit fried foods.  Cook foods using methods other than frying.  Avoid drinking alcohol.  Avoid drinking large amounts of liquids with your meals.  Avoid bending over or lying down until 2-3 hours after eating. WHAT FOODS ARE NOT RECOMMENDED? The following are some foods and drinks that may worsen your symptoms: Vegetables Tomatoes. Tomato juice. Tomato and spaghetti sauce. Chili peppers. Onion and garlic. Horseradish. Fruits Oranges, grapefruit, and lemon (fruit and juice). Meats High-fat meats, fish, and poultry. This includes hot dogs, ribs, ham, sausage, salami, and bacon. Dairy Whole milk and chocolate milk. Sour cream. Cream. Butter. Ice cream. Cream cheese.  Beverages Coffee and tea, with or without caffeine. Carbonated beverages or energy drinks. Condiments Hot sauce. Barbecue sauce.  Sweets/Desserts Chocolate and cocoa. Donuts. Peppermint and spearmint. Fats and Oils High-fat foods, including French fries and potato  chips. Other Vinegar. Strong spices, such as black pepper, white pepper, red pepper, cayenne, curry powder, cloves, ginger, and chili powder.  

## 2014-03-20 LAB — TSH: TSH: 0.828 u[IU]/mL (ref 0.350–4.500)

## 2014-03-20 LAB — HEMOGLOBIN A1C
HEMOGLOBIN A1C: 5.9 % — AB (ref <5.7)
Mean Plasma Glucose: 123 mg/dL — ABNORMAL HIGH (ref <117)

## 2014-03-20 LAB — LIPID PANEL
Cholesterol: 168 mg/dL (ref 0–200)
HDL: 65 mg/dL (ref >39)
LDL Cholesterol: 84 mg/dL (ref 0–99)
Total CHOL/HDL Ratio: 2.6 Ratio
Triglycerides: 95 mg/dL (ref <150)
VLDL: 19 mg/dL (ref 0–40)

## 2014-03-20 LAB — BASIC METABOLIC PANEL WITH GFR
BUN: 14 mg/dL (ref 6–23)
CO2: 29 meq/L (ref 19–32)
Calcium: 9.4 mg/dL (ref 8.4–10.5)
Chloride: 102 mEq/L (ref 96–112)
Creat: 0.62 mg/dL (ref 0.50–1.10)
GFR, Est African American: >89 mL/min
GFR, Est Non African American: >89 mL/min
GLUCOSE: 116 mg/dL — AB (ref 70–99)
POTASSIUM: 3.9 meq/L (ref 3.5–5.3)
Sodium: 140 mEq/L (ref 135–145)

## 2014-03-20 LAB — HEPATIC FUNCTION PANEL
ALK PHOS: 47 U/L (ref 39–117)
ALT: 20 U/L (ref 0–35)
AST: 24 U/L (ref 0–37)
Albumin: 4.6 g/dL (ref 3.5–5.2)
BILIRUBIN DIRECT: 0.1 mg/dL (ref 0.0–0.3)
BILIRUBIN INDIRECT: 0.3 mg/dL (ref 0.2–1.2)
Total Bilirubin: 0.4 mg/dL (ref 0.2–1.2)
Total Protein: 6.6 g/dL (ref 6.0–8.3)

## 2014-03-20 LAB — MAGNESIUM: Magnesium: 2 mg/dL (ref 1.5–2.5)

## 2014-03-20 LAB — INSULIN, FASTING: Insulin fasting, serum: 57 u[IU]/mL — ABNORMAL HIGH (ref 3–28)

## 2014-06-09 ENCOUNTER — Ambulatory Visit (INDEPENDENT_AMBULATORY_CARE_PROVIDER_SITE_OTHER): Payer: BC Managed Care – PPO | Admitting: *Deleted

## 2014-06-09 DIAGNOSIS — Z23 Encounter for immunization: Secondary | ICD-10-CM

## 2014-06-11 ENCOUNTER — Ambulatory Visit (INDEPENDENT_AMBULATORY_CARE_PROVIDER_SITE_OTHER): Payer: BC Managed Care – PPO | Admitting: *Deleted

## 2014-06-11 ENCOUNTER — Telehealth: Payer: Self-pay | Admitting: *Deleted

## 2014-06-11 DIAGNOSIS — Z889 Allergy status to unspecified drugs, medicaments and biological substances status: Secondary | ICD-10-CM

## 2014-06-11 DIAGNOSIS — T7840XA Allergy, unspecified, initial encounter: Secondary | ICD-10-CM

## 2014-06-11 MED ORDER — PREDNISONE 20 MG PO TABS: ORAL_TABLET | ORAL | Status: DC

## 2014-06-11 MED ORDER — DOXYCYCLINE HYCLATE 100 MG PO TABS: 100.0000 mg | ORAL_TABLET | Freq: Two times a day (BID) | ORAL | Status: AC

## 2014-06-11 NOTE — Telephone Encounter (Signed)
Patient called and states she got Prevnar-13 injection on 06/09/2014 and her right arm at the injection site is red, swelling and painful. Per Dr Oneta RackMcKeown, OK to send in RX for Prednisone.  Left message to inform patient she does not have to take the entire RX if redness resolves.  Patient called back and stated she is starting to has congestion and a red streak under her right arm.  Per Dr Oneta RackMcKeown, patient can come in the office and he will evaluate.

## 2014-06-11 NOTE — Progress Notes (Signed)
Got Prevnar 13 on Tuesday in her right arm, she started to have reaction immediately in that arm. She states yesterday she had an area of erythema the size of a quarter but today it is the size of a base ball with some extending into the axilla with + lymphadenopathy and warmth. Sent in prednisone and doxycyline since we are going into the weekend for possible cellulits. No fever, chills, trouble swallowing, sore throat, SOB, CP.

## 2014-09-18 ENCOUNTER — Ambulatory Visit (HOSPITAL_COMMUNITY)
Admission: RE | Admit: 2014-09-18 | Discharge: 2014-09-18 | Disposition: A | Discharge status: Home / Self Care | Payer: BLUE CROSS/BLUE SHIELD | Source: Ambulatory Visit | Attending: Physician Assistant | Admitting: Physician Assistant

## 2014-09-18 ENCOUNTER — Ambulatory Visit (INDEPENDENT_AMBULATORY_CARE_PROVIDER_SITE_OTHER): Payer: BLUE CROSS/BLUE SHIELD | Admitting: Physician Assistant

## 2014-09-18 ENCOUNTER — Encounter: Payer: Self-pay | Admitting: Physician Assistant

## 2014-09-18 VITALS — BP 128/72 | HR 76 | Temp 98.1°F | Resp 16 | Ht 67.5 in | Wt 140.0 lb

## 2014-09-18 DIAGNOSIS — J45909 Unspecified asthma, uncomplicated: Secondary | ICD-10-CM | POA: Diagnosis not present

## 2014-09-18 DIAGNOSIS — R079 Chest pain, unspecified: Secondary | ICD-10-CM | POA: Insufficient documentation

## 2014-09-18 DIAGNOSIS — Z79899 Other long term (current) drug therapy: Secondary | ICD-10-CM

## 2014-09-18 DIAGNOSIS — R062 Wheezing: Secondary | ICD-10-CM | POA: Diagnosis not present

## 2014-09-18 DIAGNOSIS — R7303 Prediabetes: Secondary | ICD-10-CM

## 2014-09-18 DIAGNOSIS — R6889 Other general symptoms and signs: Secondary | ICD-10-CM

## 2014-09-18 DIAGNOSIS — I10 Essential (primary) hypertension: Secondary | ICD-10-CM

## 2014-09-18 DIAGNOSIS — E785 Hyperlipidemia, unspecified: Secondary | ICD-10-CM

## 2014-09-18 DIAGNOSIS — G8929 Other chronic pain: Secondary | ICD-10-CM | POA: Insufficient documentation

## 2014-09-18 DIAGNOSIS — E559 Vitamin D deficiency, unspecified: Secondary | ICD-10-CM

## 2014-09-18 DIAGNOSIS — B079 Viral wart, unspecified: Secondary | ICD-10-CM

## 2014-09-18 DIAGNOSIS — Z0001 Encounter for general adult medical examination with abnormal findings: Secondary | ICD-10-CM

## 2014-09-18 DIAGNOSIS — R7309 Other abnormal glucose: Secondary | ICD-10-CM

## 2014-09-18 LAB — CBC WITH DIFFERENTIAL/PLATELET
Basophils Absolute: 0 K/uL (ref 0.0–0.1)
Basophils Relative: 0 % (ref 0–1)
Eosinophils Absolute: 0 K/uL (ref 0.0–0.7)
Eosinophils Relative: 1 % (ref 0–5)
HCT: 38.9 % (ref 36.0–46.0)
Hemoglobin: 12.9 g/dL (ref 12.0–15.0)
Lymphocytes Relative: 31 % (ref 12–46)
Lymphs Abs: 1.5 K/uL (ref 0.7–4.0)
MCH: 31.5 pg (ref 26.0–34.0)
MCHC: 33.2 g/dL (ref 30.0–36.0)
MCV: 94.9 fL (ref 78.0–100.0)
MPV: 9.3 fL (ref 8.6–12.4)
Monocytes Absolute: 0.6 K/uL (ref 0.1–1.0)
Monocytes Relative: 12 % (ref 3–12)
Neutro Abs: 2.7 K/uL (ref 1.7–7.7)
Neutrophils Relative %: 56 % (ref 43–77)
Platelets: 260 K/uL (ref 150–400)
RBC: 4.1 MIL/uL (ref 3.87–5.11)
RDW: 13.2 % (ref 11.5–15.5)
WBC: 4.9 K/uL (ref 4.0–10.5)

## 2014-09-18 NOTE — Progress Notes (Signed)
Complete Physical  Assessment and Plan: 1. Prediabetes - Hemoglobin A1c - Insulin, fasting - Urinalysis, Routine w reflex microscopic - Microalbumin / creatinine urine ratio  2. Hyperlipidemia - CBC with Differential/Platelet - BASIC METABOLIC PANEL WITH GFR - Hepatic function panel - Lipid panel - TSH  3. Vitamin D deficiency - Vit D  25 hydroxy (rtn osteoporosis monitoring)  4. Left sided chest pain Very atypical, non exertional, intermittent, has tried nexium no help, get EKG and CXR, and  - EKG 12-Lead - DG Chest 2 View; Future  5. Encounter for general adult medical examination with abnormal findings  6. Medication management - Magnesium  7. Verruca warts Has tried OTC treatment will come back in for removal.   Discussed med's effects and SE's. Screening labs and tests as requested with regular follow-up as recommended.  HPI 54 y.o. female  presents for a complete physical. Her blood pressure has been controlled at home, today their BP is BP: 128/72 mmHg She denies chest pain, shortness of breath, dizziness. She does workout.  Her cholesterol is diet controlled.  Her cholesterol is at goal. The cholesterol last visit was:  Lab Results  Component Value Date   CHOL 168 03/19/2014   HDL 65 03/19/2014   LDLCALC 84 03/19/2014   TRIG 95 03/19/2014   CHOLHDL 2.6 03/19/2014   She has been working on diet and exercise for prediabetes, and denies blurry vision, polydipsia, polyphagia and polyuria. Last A1C in the office was:  Lab Results  Component Value Date   HGBA1C 5.9* 03/19/2014  Patient is on Vitamin D supplement, 2000 IU daily  Continues to have a problem with yeast infections, she uses boric acid occ for yeast which helps. She is on estrogen patch.  Atypical intermittent left sided chest/breast pressure x 1 year, nothing worse or better, zantac does not help, no radiation and no accompaniments.   Current Medications:  Current Outpatient Prescriptions on File  Prior to Visit  Medication Sig Dispense Refill  . B Complex-C (SUPER B COMPLEX PO) Take 1 tablet by mouth daily.    . Cetirizine HCl (ZYRTEC ALLERGY PO) Take 1 tablet by mouth daily.    . Cholecalciferol (VITAMIN D3) 2000 UNITS TABS Take 1 tablet by mouth daily.    Marland Kitchen. estradiol-levonorgestrel (CLIMARAPRO) 0.045-0.015 MG/DAY Place 1 patch onto the skin once a week. Swapped out on Wednesdays.    . Multiple Vitamins-Minerals (CENTRUM SILVER PO) Take 1 tablet by mouth daily.    . Probiotic Product (PROBIOTIC DAILY PO) Take by mouth daily. Digestive Advantage    . Turmeric 500 MG CAPS Take 1 tablet by mouth daily.     No current facility-administered medications on file prior to visit.   Health Maintenance:  Immunization History  Administered Date(s) Administered  . Influenza Split 06/09/2014  . Influenza-Unspecified 05/26/2013  . Pneumococcal Conjugate-13 06/09/2014  . Pneumococcal-Unspecified 08/06/2010  . Tdap 12/21/2011   Tetanus: 2013 Pneumovax: 2012 Prevnar 13: 2015- had a reaction Flu vaccine: 04/2014  Zostavax:N/A Pap: April 2015 Dr. Chevis PrettyMezer MGM: 12/2013 DEXA: 12/2013- normal Colonoscopy: 07/2011 due 2017 Dr. Tanna [facility]oyamsay [facility]: 2000 [facility]ry[facility]Dr[facility] Luetta NuttingLaf[facility] DEE 3 years in [place]  Allergies:  Allergies  Allergen Reactions  . Erythromycin   . Singulair [Montelukast Sodium]    Medical History:  Past Medical History  Diagnosis Date  . Asthma     as a child  . Hypotension   . Hyperlipidemia   . Diabetes  pre-diabetic  . Vitamin D deficiency   . Hemorrhoids   . Diverticulosis   . Allergy   . GERD (gastroesophageal reflux disease)    Surgical History:  Past Surgical History  Procedure Laterality Date  . Nasal septum surgery    . Carpal tunnel release Bilateral   . Colonoscopy  07/2011    Due 2017, Hem and tics, Dr. Tanna Savoy   Family History:  Family History  Problem Relation Age of Onset  . Other Father     colon resection   . Cancer Father 64    colon  . Colon cancer Maternal Grandmother    Social History:  History   Social History  . Marital Status: Single    Spouse Name: N/A  . Number of Children: 0  . Years of Education: N/A   Occupational History  . electrician    Social History Main Topics  . Smoking status: Former Smoker -- 1.00 packs/day for 8 years    Types: Cigarettes    Quit date: 08/01/1975  . Smokeless tobacco: Never Used  . Alcohol Use: Yes     Comment: Occasional on weekend  . Drug Use: Yes    Special: Marijuana  . Sexual Activity: Yes   Other Topics Concern  . Not on file   Social History Narrative   ROS Review of Systems  HENT: Negative.   Eyes: Negative.   Respiratory: Negative.   Cardiovascular: Positive for chest pain. Negative for palpitations, orthopnea, claudication, leg swelling and PND.  Gastrointestinal: Negative.   Genitourinary: Negative.        + recurrent yeast  Musculoskeletal: Negative.   Skin: Positive for rash (yeast on buttocks occ). Negative for itching.  Neurological: Negative.   Psychiatric/Behavioral: Negative.      Physical Exam: Estimated body mass index is 21.59 kg/(m^2) as calculated from the following:   Height as of this encounter: 5' 7.5" (1.715 m).   Weight as of this encounter: 140 lb (63.504 kg). Filed Vitals:   09/18/14 0933  BP: 128/72  Pulse: 76  Temp: 98.1 F (36.7 C)  Resp: 16   General Appearance: Well nourished, in no apparent distress. Eyes: PERRLA, EOMs, conjunctiva no swelling or erythema, normal fundi and vessels. Sinuses: No Frontal/maxillary tenderness ENT/Mouth: Ext aud canals clear, normal light reflex with TMs without erythema, bulging.  Good dentition. No erythema, swelling, or exudate on post pharynx. Tonsils not swollen or erythematous. Hearing normal.  Neck: Supple, thyroid normal. No bruits Respiratory: Respiratory effort normal, BS equal bilaterally without rales, rhonchi, wheezing or  stridor. Cardio: RRR without murmurs, rubs or gallops. Brisk peripheral pulses without edema.  Chest: symmetric, with normal excursions and percussion. Breasts: defer Abdomen: Soft, +BS. Non tender, no guarding, rebound, hernias, masses, or organomegaly. .  Lymphatics: Non tender without lymphadenopathy.  Genitourinary: defer Musculoskeletal: Full ROM all peripheral extremities,5/5 strength, and normal gait. Skin: Warm, dry without rashes, lesions, ecchymosis. + warts bilateral hands.  Neuro: Cranial nerves intact, reflexes equal bilaterally. Normal muscle tone, no cerebellar symptoms. Sensation intact.  Psych: Awake and oriented X 3, normal affect, Insight and Judgment appropriate.   EKG: IRBBB, no ST changes from previous year   Quentin Mulling 2:13 PM

## 2014-09-19 LAB — URINALYSIS, ROUTINE W REFLEX MICROSCOPIC
BILIRUBIN URINE: NEGATIVE
GLUCOSE, UA: NEGATIVE mg/dL
Hgb urine dipstick: NEGATIVE
KETONES UR: NEGATIVE mg/dL
Leukocytes, UA: NEGATIVE
Nitrite: NEGATIVE
PH: 6.5 (ref 5.0–8.0)
Protein, ur: NEGATIVE mg/dL
Specific Gravity, Urine: 1.005 (ref 1.005–1.030)
Urobilinogen, UA: 0.2 mg/dL (ref 0.0–1.0)

## 2014-09-19 LAB — BASIC METABOLIC PANEL WITH GFR
BUN: 10 mg/dL (ref 6–23)
CHLORIDE: 105 meq/L (ref 96–112)
CO2: 28 mEq/L (ref 19–32)
CREATININE: 0.56 mg/dL (ref 0.50–1.10)
Calcium: 9.2 mg/dL (ref 8.4–10.5)
GFR, Est African American: >89 mL/min
Glucose, Bld: 70 mg/dL (ref 70–99)
Potassium: 4.3 mEq/L (ref 3.5–5.3)
Sodium: 138 mEq/L (ref 135–145)

## 2014-09-19 LAB — VITAMIN D 25 HYDROXY (VIT D DEFICIENCY, FRACTURES): VIT D 25 HYDROXY: 48 ng/mL (ref 30–100)

## 2014-09-19 LAB — MICROALBUMIN / CREATININE URINE RATIO
Creatinine, Urine: 15.6 mg/dL
Microalb, Ur: <0.2 mg/dL (ref <2.0)

## 2014-09-19 LAB — LIPID PANEL
Cholesterol: 176 mg/dL (ref 0–200)
HDL: 71 mg/dL (ref >39)
LDL Cholesterol: 97 mg/dL (ref 0–99)
Total CHOL/HDL Ratio: 2.5 Ratio
Triglycerides: 41 mg/dL (ref <150)
VLDL: 8 mg/dL (ref 0–40)

## 2014-09-19 LAB — INSULIN, FASTING: Insulin fasting, serum: 1.9 u[IU]/mL — ABNORMAL LOW (ref 2.0–19.6)

## 2014-09-19 LAB — HEPATIC FUNCTION PANEL
ALBUMIN: 4.4 g/dL (ref 3.5–5.2)
ALK PHOS: 41 U/L (ref 39–117)
ALT: 18 U/L (ref 0–35)
AST: 24 U/L (ref 0–37)
BILIRUBIN DIRECT: 0.1 mg/dL (ref 0.0–0.3)
BILIRUBIN TOTAL: 0.5 mg/dL (ref 0.2–1.2)
Indirect Bilirubin: 0.4 mg/dL (ref 0.2–1.2)
Total Protein: 6.7 g/dL (ref 6.0–8.3)

## 2014-09-19 LAB — MAGNESIUM: Magnesium: 2 mg/dL (ref 1.5–2.5)

## 2014-09-19 LAB — TSH: TSH: 0.622 u[IU]/mL (ref 0.350–4.500)

## 2014-09-19 LAB — HEMOGLOBIN A1C
Hgb A1c MFr Bld: 5.9 % — ABNORMAL HIGH (ref <5.7)
Mean Plasma Glucose: 123 mg/dL — ABNORMAL HIGH (ref <117)

## 2014-09-24 ENCOUNTER — Telehealth: Payer: Self-pay | Admitting: Physician Assistant

## 2014-09-24 MED ORDER — PREDNISONE 20 MG PO TABS: ORAL_TABLET | ORAL | Status: DC

## 2014-09-24 MED ORDER — DOXYCYCLINE HYCLATE 100 MG PO TABS: 100.0000 mg | ORAL_TABLET | Freq: Two times a day (BID) | ORAL | Status: DC

## 2014-09-24 MED ORDER — BENZONATATE 100 MG PO CAPS: 100.0000 mg | ORAL_CAPSULE | Freq: Four times a day (QID) | ORAL | Status: DC | PRN

## 2014-09-24 NOTE — Telephone Encounter (Signed)
54 y.o. female with history of COPD presents with 2 days of sore throat, chest pain, fatigue, SOB, cough with yellow mucus. Has been on zyrtec.  Will send in prednisone, tessalon, and Doxycycline.

## 2014-10-08 ENCOUNTER — Encounter: Payer: Self-pay | Admitting: Physician Assistant

## 2014-10-08 ENCOUNTER — Ambulatory Visit (INDEPENDENT_AMBULATORY_CARE_PROVIDER_SITE_OTHER): Payer: BLUE CROSS/BLUE SHIELD | Admitting: Physician Assistant

## 2014-10-08 VITALS — BP 102/62 | HR 76 | Temp 98.1°F | Resp 16 | Ht 67.5 in | Wt 141.0 lb

## 2014-10-08 DIAGNOSIS — R059 Cough, unspecified: Secondary | ICD-10-CM

## 2014-10-08 DIAGNOSIS — B079 Viral wart, unspecified: Secondary | ICD-10-CM

## 2014-10-08 DIAGNOSIS — R05 Cough: Secondary | ICD-10-CM

## 2014-10-08 NOTE — Patient Instructions (Signed)
Warts Warts are a common viral infection. They are most commonly caused by the human papillomavirus (HPV). Warts can occur at all ages. However, they occur most frequently in older children and infrequently in the elderly. Warts may be single or multiple. Location and size varies. Warts can be spread by scratching the wart and then scratching normal skin. The life cycle of warts varies. However, most will disappear over many months to a couple years. Warts commonly do not cause problems (asymptomatic) unless they are over an area of pressure, such as the bottom of the foot. If they are large enough, they may cause pain with walking. DIAGNOSIS  Warts are most commonly diagnosed by their appearance. Tissue samples (biopsies) are not required unless the wart looks abnormal. Most warts have a rough surface, are round, oval, or irregular, and are skin-colored to light yellow, brown, or gray. They are generally less than  inch (1.3 cm), but they can be any size. TREATMENT   Observation or no treatment.  Freezing with liquid nitrogen.  High heat (cautery).  Boosting the body's immunity to fight off the wart (immunotherapy using Candida antigen).  Laser surgery.  Application of various irritants and solutions. HOME CARE INSTRUCTIONS  Follow your caregiver's instructions. No special precautions are necessary. Often, treatment may be followed by a return (recurrence) of warts. Warts are generally difficult to treat and get rid of. If treatment is done in a clinic setting, usually more than 1 treatment is required. This is usually done on only a monthly basis until the wart is completely gone. SEEK IMMEDIATE MEDICAL CARE IF: The treated skin becomes red, puffy (swollen), or painful. Document Released: 04/26/2005 Document Revised: 11/11/2012 Document Reviewed: 10/22/2009 Exit[employer] Patient Information 2015 [employer]. This information is not intended to replace advice given to you by your health care  provider. Make sure you discuss any questions you have with your health care provider.  

## 2014-10-08 NOTE — Progress Notes (Signed)
Subjective:    Carla Morton is a 54 y.o. female who complains of warts. The warts are located on 4 on the left hand and 1 on the right 3rd digit. They have been present for 1 year. The patient denies pain or cellulitic infection symptoms.  The following portions of the patient's history were reviewed and updated as appropriate: allergies, current medications, past family history, past medical history, past social history, past surgical history and problem list.  Review of Systems Pertinent items are noted in HPI.    Objective:    Skin: 4 warts noted on left hand and 1 right hand. Size range is 5x5 mm.    Assessment:    Warts (Verruca Vulgaris)   Cough/COPD- continue meds sent in   Plan:    1. The viral etiology and natural history has been discussed.  2. Various treatment methods, side effects and failure rates have been discussed.   3. A choice of liquid nitrogen was made, and the expected blistering or scabbing reaction explained. 4. Liquid nitrogen was applied to 3 warts for 3 freeze/thaw cycles. 5. The patient will return at 2-4 week intervals for retreatment's as needed.   6. Electrocaudry was used on 3 others that were larger and failed out patient treatment.

## 2015-01-25 ENCOUNTER — Other Ambulatory Visit: Payer: Self-pay

## 2015-03-23 ENCOUNTER — Ambulatory Visit (INDEPENDENT_AMBULATORY_CARE_PROVIDER_SITE_OTHER): Payer: BLUE CROSS/BLUE SHIELD | Admitting: Physician Assistant

## 2015-03-23 ENCOUNTER — Encounter: Payer: Self-pay | Admitting: Physician Assistant

## 2015-03-23 VITALS — BP 116/60 | HR 72 | Temp 98.9°F | Resp 16 | Ht 67.5 in | Wt 138.4 lb

## 2015-03-23 DIAGNOSIS — R7309 Other abnormal glucose: Secondary | ICD-10-CM

## 2015-03-23 DIAGNOSIS — E785 Hyperlipidemia, unspecified: Secondary | ICD-10-CM | POA: Diagnosis not present

## 2015-03-23 DIAGNOSIS — E559 Vitamin D deficiency, unspecified: Secondary | ICD-10-CM | POA: Diagnosis not present

## 2015-03-23 DIAGNOSIS — Z79899 Other long term (current) drug therapy: Secondary | ICD-10-CM | POA: Diagnosis not present

## 2015-03-23 DIAGNOSIS — F411 Generalized anxiety disorder: Secondary | ICD-10-CM

## 2015-03-23 DIAGNOSIS — R7303 Prediabetes: Secondary | ICD-10-CM

## 2015-03-23 LAB — CBC WITH DIFFERENTIAL/PLATELET
BASOS PCT: 1 % (ref 0–1)
Basophils Absolute: 0 10*3/uL (ref 0.0–0.1)
Eosinophils Absolute: 0.1 10*3/uL (ref 0.0–0.7)
Eosinophils Relative: 2 % (ref 0–5)
HCT: 37.5 % (ref 36.0–46.0)
HEMOGLOBIN: 12.6 g/dL (ref 12.0–15.0)
LYMPHS PCT: 38 % (ref 12–46)
Lymphs Abs: 1.5 10*3/uL (ref 0.7–4.0)
MCH: 32.1 pg (ref 26.0–34.0)
MCHC: 33.6 g/dL (ref 30.0–36.0)
MCV: 95.7 fL (ref 78.0–100.0)
MONO ABS: 0.6 10*3/uL (ref 0.1–1.0)
MPV: 9.1 fL (ref 8.6–12.4)
Monocytes Relative: 16 % — ABNORMAL HIGH (ref 3–12)
NEUTROS ABS: 1.7 10*3/uL (ref 1.7–7.7)
NEUTROS PCT: 43 % (ref 43–77)
Platelets: 229 10*3/uL (ref 150–400)
RBC: 3.92 MIL/uL (ref 3.87–5.11)
RDW: 13.4 % (ref 11.5–15.5)
WBC: 4 10*3/uL (ref 4.0–10.5)

## 2015-03-23 MED ORDER — CLONAZEPAM 1 MG PO TABS: 1.0000 mg | ORAL_TABLET | Freq: Every evening | ORAL | Status: DC | PRN

## 2015-03-23 NOTE — Progress Notes (Signed)
Assessment and Plan:  Hypertension: Continue medication, monitor blood pressure at home. Continue DASH diet. Cholesterol: Continue diet and exercise. Check cholesterol.  Pre-diabetes-Continue diet and exercise. Check A1C Vitamin D Def- check level and continue medications.  Anxiety- start new medication prescribed, klonopin, stress management techniques discussed, increase water, good sleep hygiene discussed, increase exercise, and increase veggies. Follow up 1 month, call the office if any new AE's from medications and we will switch them.     Continue diet and meds as discussed. Further disposition pending results of labs.  HPI 54 y.o. female  presents for 3 month follow up with hypertension, hyperlipidemia, prediabetes and vitamin D. Her blood pressure has been controlled at home, today their BP is BP: 116/60 mmHg She does not workout but her job is very physical and she is very active at home. She denies chest pain, shortness of breath, dizziness.  She is not on cholesterol medication and denies myalgias. Her cholesterol is at goal. The cholesterol last visit was:   Lab Results  Component Value Date   CHOL 176 09/18/2014   HDL 71 09/18/2014   LDLCALC 97 09/18/2014   TRIG 41 09/18/2014   CHOLHDL 2.5 09/18/2014   She has been working on diet and exercise for prediabetes, and denies polydipsia, polyuria and visual disturbances. Last A1C in the office was:  Lab Results  Component Value Date   HGBA1C 5.9* 09/18/2014   Patient is on Vitamin D supplement.   Lab Results  Component Value Date   VD25OH 107 09/18/2014     She has been very tired, she is stressed from work, her dog died from copper head bite. She has not been sleeping well.   Current Medications:  Current Outpatient Prescriptions on File Prior to Visit  Medication Sig Dispense Refill  . B Complex-C (SUPER B COMPLEX PO) Take 1 tablet by mouth daily.    . Cetirizine HCl (ZYRTEC ALLERGY PO) Take 1 tablet by mouth daily.     . Cholecalciferol (VITAMIN D3) 2000 UNITS TABS Take 1 tablet by mouth daily.    Marland Kitchen estradiol-levonorgestrel (CLIMARAPRO) 0.045-0.015 MG/DAY Place 1 patch onto the skin once a week. Swapped out on Wednesdays.    . Multiple Vitamins-Minerals (CENTRUM SILVER PO) Take 1 tablet by mouth daily.    . Probiotic Product (PROBIOTIC DAILY PO) Take by mouth daily. Digestive Advantage    . Turmeric 500 MG CAPS Take 1 tablet by mouth daily.     No current facility-administered medications on file prior to visit.   Medical History:  Past Medical History  Diagnosis Date  . Asthma     as a child  . Hypotension   . Hyperlipidemia   . Diabetes     pre-diabetic  . Vitamin D deficiency   . Hemorrhoids   . Diverticulosis   . Allergy   . GERD (gastroesophageal reflux disease)    Allergies:  Allergies  Allergen Reactions  . Erythromycin   . Singulair [Montelukast Sodium]     Review of Systems  Constitutional: Positive for malaise/fatigue. Negative for fever, chills, weight loss and diaphoresis.  HENT: Negative.   Eyes: Negative.   Respiratory: Negative.   Cardiovascular: Negative.   Gastrointestinal: Negative.   Genitourinary: Negative.   Musculoskeletal: Positive for joint pain (knee pain). Negative for myalgias, back pain, falls and neck pain.  Skin: Negative.   Neurological: Negative.  Negative for weakness.  Endo/Heme/Allergies: Negative.   Psychiatric/Behavioral: Positive for depression. Negative for suicidal ideas, hallucinations, memory loss and  substance abuse. The patient is nervous/anxious and has insomnia.      Family history- Review and unchanged Social history- Review and unchanged Physical Exam: BP 116/60 mmHg  Pulse 72  Temp(Src) 98.9 F (37.2 C)  Resp 16  Ht 5' 7.5" (1.715 m)  Wt 138 lb 6.4 oz (62.778 kg)  BMI 21.34 kg/m2 Wt Readings from Last 3 Encounters:  03/23/15 138 lb 6.4 oz (62.778 kg)  10/08/14 141 lb (63.957 kg)  09/18/14 140 lb (63.504 kg)   General  Appearance: Well nourished, in no apparent distress. Eyes: PERRLA, EOMs, conjunctiva no swelling or erythema Sinuses: No Frontal/maxillary tenderness ENT/Mouth: Ext aud canals clear, TMs without erythema, bulging. No erythema, swelling, or exudate on post pharynx.  Tonsils not swollen or erythematous. Hearing normal.  Neck: Supple, thyroid normal.  Respiratory: Respiratory effort normal, BS equal bilaterally without rales, rhonchi, wheezing or stridor.  Cardio: RRR with no MRGs. Brisk peripheral pulses without edema.  Abdomen: Soft, + BS.  Non tender, no guarding, rebound, hernias, masses. Lymphatics: Non tender without lymphadenopathy.  Musculoskeletal: Full ROM, 5/5 strength, normal gait.  Skin: Warm, dry without rashes, lesions, ecchymosis.  Neuro: Cranial nerves intact. Normal muscle tone, no cerebellar symptoms. Sensation intact.  Psych: Awake and oriented X 3, normal affect, Insight and Judgment appropriate.    Quentin Mulling 8:51 AM

## 2015-03-23 NOTE — Patient Instructions (Signed)
Generalized Anxiety Disorder Generalized anxiety disorder (GAD) is a mental disorder. It interferes with life functions, including relationships, work, and school. GAD is different from normal anxiety, which everyone experiences at some point in their lives in response to specific life events and activities. Normal anxiety actually helps us prepare for and get through these life events and activities. Normal anxiety goes away after the event or activity is over.  GAD causes anxiety that is not necessarily related to specific events or activities. It also causes excess anxiety in proportion to specific events or activities. The anxiety associated with GAD is also difficult to control. GAD can vary from mild to severe. People with severe GAD can have intense waves of anxiety with physical symptoms (panic attacks).  SYMPTOMS The anxiety and worry associated with GAD are difficult to control. This anxiety and worry are related to many life events and activities and also occur more days than not for 6 months or longer. People with GAD also have three or more of the following symptoms (one or more in children):  Restlessness.   Fatigue.  Difficulty concentrating.   Irritability.  Muscle tension.  Difficulty sleeping or unsatisfying sleep. DIAGNOSIS GAD is diagnosed through an assessment by your health care provider. Your health care provider will ask you questions aboutyour mood,physical symptoms, and events in your life. Your health care provider may ask you about your medical history and use of alcohol or drugs, including prescription medicines. Your health care provider may also do a physical exam and blood tests. Certain medical conditions and the use of certain substances can cause symptoms similar to those associated with GAD. Your health care provider may refer you to a mental health specialist for further evaluation. TREATMENT The following therapies are usually used to treat GAD:    Medication. Antidepressant medication usually is prescribed for long-term daily control. Antianxiety medicines may be added in severe cases, especially when panic attacks occur.   Talk therapy (psychotherapy). Certain types of talk therapy can be helpful in treating GAD by providing support, education, and guidance. A form of talk therapy called cognitive behavioral therapy can teach you healthy ways to think about and react to daily life events and activities.  Stress managementtechniques. These include yoga, meditation, and exercise and can be very helpful when they are practiced regularly. A mental health specialist can help determine which treatment is best for you. Some people see improvement with one therapy. However, other people require a combination of therapies. Document Released: 11/11/2012 Document Revised: 12/01/2013 Document Reviewed: 11/11/2012 [employer] Patient Information 2015 [employer]. This information is not intended to replace advice given to you by your health care provider. Make sure you discuss any questions you have with your health care provider.  

## 2015-03-24 LAB — LIPID PANEL
Cholesterol: 168 mg/dL (ref 125–200)
HDL: 70 mg/dL (ref >=46)
LDL CALC: 87 mg/dL (ref <130)
TRIGLYCERIDES: 57 mg/dL (ref <150)
Total CHOL/HDL Ratio: 2.4 Ratio (ref <=5.0)
VLDL: 11 mg/dL (ref <30)

## 2015-03-24 LAB — HEPATIC FUNCTION PANEL
ALK PHOS: 40 U/L (ref 33–130)
ALT: 17 U/L (ref 6–29)
AST: 20 U/L (ref 10–35)
Albumin: 4.5 g/dL (ref 3.6–5.1)
BILIRUBIN DIRECT: 0.1 mg/dL (ref <=0.2)
BILIRUBIN INDIRECT: 0.4 mg/dL (ref 0.2–1.2)
TOTAL PROTEIN: 6.6 g/dL (ref 6.1–8.1)
Total Bilirubin: 0.5 mg/dL (ref 0.2–1.2)

## 2015-03-24 LAB — MAGNESIUM: Magnesium: 2 mg/dL (ref 1.5–2.5)

## 2015-03-24 LAB — BASIC METABOLIC PANEL WITH GFR
BUN: 11 mg/dL (ref 7–25)
CHLORIDE: 103 mmol/L (ref 98–110)
CO2: 28 mmol/L (ref 20–31)
Calcium: 9.3 mg/dL (ref 8.6–10.4)
Creat: 0.58 mg/dL (ref 0.50–1.05)
GFR, Est African American: >89 mL/min (ref >=60)
GLUCOSE: 62 mg/dL — AB (ref 65–99)
POTASSIUM: 4.6 mmol/L (ref 3.5–5.3)
SODIUM: 142 mmol/L (ref 135–146)

## 2015-03-24 LAB — HEMOGLOBIN A1C
Hgb A1c MFr Bld: 5.6 % (ref <5.7)
Mean Plasma Glucose: 114 mg/dL (ref <117)

## 2015-03-24 LAB — INSULIN, FASTING: Insulin fasting, serum: 5.3 u[IU]/mL (ref 2.0–19.6)

## 2015-03-24 LAB — TSH: TSH: 1.024 u[IU]/mL (ref 0.350–4.500)

## 2015-03-24 LAB — VITAMIN D 25 HYDROXY (VIT D DEFICIENCY, FRACTURES): Vit D, 25-Hydroxy: 52 ng/mL (ref 30–100)

## 2015-05-15 ENCOUNTER — Ambulatory Visit (INDEPENDENT_AMBULATORY_CARE_PROVIDER_SITE_OTHER): Payer: BLUE CROSS/BLUE SHIELD | Admitting: Family Medicine

## 2015-05-15 VITALS — BP 120/80 | HR 71 | Temp 97.7°F | Resp 18 | Ht 68.0 in | Wt 136.5 lb

## 2015-05-15 DIAGNOSIS — R1011 Right upper quadrant pain: Secondary | ICD-10-CM | POA: Diagnosis not present

## 2015-05-15 DIAGNOSIS — R109 Unspecified abdominal pain: Secondary | ICD-10-CM

## 2015-05-15 LAB — POC MICROSCOPIC URINALYSIS (UMFC)

## 2015-05-15 LAB — POCT URINALYSIS DIP (MANUAL ENTRY)
Bilirubin, UA: NEGATIVE
Blood, UA: NEGATIVE
Glucose, UA: NEGATIVE
Ketones, POC UA: NEGATIVE
Leukocytes, UA: NEGATIVE
Nitrite, UA: NEGATIVE
Protein Ur, POC: NEGATIVE
Spec Grav, UA: 1.02
Urobilinogen, UA: 0.2
pH, UA: 7

## 2015-05-15 MED ORDER — PREDNISONE 20 MG PO TABS: ORAL_TABLET | ORAL | Status: DC

## 2015-05-15 MED ORDER — OXYCODONE-ACETAMINOPHEN 5-325 MG PO TABS: 1.0000 | ORAL_TABLET | Freq: Three times a day (TID) | ORAL | Status: DC | PRN

## 2015-05-15 NOTE — Progress Notes (Addendum)
 @  This chart was scribed for Elvina Sidle, MD by Andrew Au, ED Scribe. This patient was seen in room 3 and the patient's care was started at 2:09 PM.  Patient ID: Carla Morton MRN: [id number], DOB: 1961-06-05, 54 y.o. Date of Encounter: 05/15/2015, 2:11 PM  Primary Physician: Nadean Corwin, MD  Chief Complaint:  Chief Complaint  Patient presents with   Back Pain    C/O lower back pain noticed after working on Tuesday-worse today.   Abdominal Pain    C/O lower right pain   Flu Vaccine    HPI: 54 y.o. year old female with history below presents with right low back pain that began 3 days ago. Pt works as an Personnel officer and states she was climbing in and out of a bucket truck 4 days ago and developed back pain the following day. She reports pain was improving for the following 2 days but had sudden grabbing pain to right low back a couple hours ago, radiating to right low abdomen. States abdominal pain is worse with sitting down, making it difficult to breath. .    Past Medical History  Diagnosis Date   Asthma     as a child   Hypotension    Hyperlipidemia    Diabetes (HCC)     pre-diabetic   Vitamin D deficiency    Hemorrhoids    Diverticulosis    Allergy    GERD (gastroesophageal reflux disease)    Arthritis      Home Meds: Prior to Admission medications   Medication Sig Start Date End Date Taking? Authorizing Provider  B Complex-C (SUPER B COMPLEX PO) Take 1 tablet by mouth daily.   Yes Historical Provider, MD  Cetirizine HCl (ZYRTEC ALLERGY PO) Take 1 tablet by mouth daily.   Yes Historical Provider, MD  Cholecalciferol (VITAMIN D3) 2000 UNITS TABS Take 1 tablet by mouth daily.   Yes Historical Provider, MD  estradiol-levonorgestrel [facility]) 0.045-0.015 MG/DAY Place 1 patch onto the skin once a week. Swapped out on Wednesdays.   Yes Historical Provider, MD  Multiple Vitamins-Minerals (CENTRUM SILVER PO) Take 1 tablet by mouth daily.    Yes Historical Provider, MD  Probiotic Product (PROBIOTIC DAILY PO) Take by mouth daily. Digestive Advantage   Yes Historical Provider, MD  Turmeric 500 MG CAPS Take 1 tablet by mouth daily.   Yes Historical Provider, MD  clonazePAM (KLONOPIN) 1 MG tablet Take 1 tablet (1 mg total) by mouth at bedtime as needed for anxiety. Patient not taking: Reported on 05/15/2015 03/23/15 03/22/16  Quentin Mulling, PA-C    Allergies:  Allergies  Allergen Reactions   Erythromycin    Singulair [Montelukast Sodium]     Social History   Social History   Marital Status: Single    Spouse Name: N/A   Number of Children: 0   Years of Education: N/A   Occupational History   electrician    Social History Main Topics   Smoking status: Former Smoker -- 1.00 packs/day for 8 years    Types: Cigarettes    Quit date: 08/01/1975   Smokeless tobacco: Never Used   Alcohol Use: Yes     Comment: Occasional on weekend   Drug Use: Yes    Special: Marijuana   Sexual Activity: Yes   Other Topics Concern   Not on file   Social History Narrative     Review of Systems: Constitutional: negative for chills, fever, night sweats, weight changes, or fatigue  HEENT: negative for vision  changes, hearing loss, congestion, rhinorrhea, ST, epistaxis, or sinus pressure Cardiovascular: negative for chest pain or palpitations Respiratory: negative for hemoptysis, wheezing, shortness of breath, or cough Abdominal: negative for abdominal pain, nausea, vomiting, diarrhea, or constipation Dermatological: negative for rash Neurologic: negative for headache, dizziness, or syncope All other systems reviewed and are otherwise negative with the exception to those above and in the HPI.   Physical Exam: Blood pressure 120/80, pulse 71, temperature 97.7 F (36.5 C), temperature source Oral, resp. rate 18, height 5\' 8"  (1.727 m), weight 136 lb 8 oz (61.916 kg), SpO2 99 %., Body mass index is 20.76 kg/(m^2). General:  Well developed, well nourished, in no acute distress. Head: Normocephalic, atraumatic, eyes without discharge, sclera non-icteric, nares are without discharge. Bilateral auditory canals clear, TM's are without perforation, pearly grey and translucent with reflective cone of light bilaterally. Oral cavity moist, posterior pharynx without exudate, erythema, peritonsillar abscess, or post nasal drip.  Neck: Supple. No thyromegaly. Full ROM. No lymphadenopathy. Lungs: Clear bilaterally to auscultation without wheezes, rales, or rhonchi. Breathing is unlabored. Heart: RRR with S1 S2. No murmurs, rubs, or gallops appreciated. Abdomen: Soft, non-tender, non-distended with normoactive bowel sounds. No hepatomegaly. No rebound/guarding. No obvious abdominal masses. Msk:  Strength and tone normal for age. Extremities/Skin: Warm and dry. No clubbing or cyanosis. No edema. No rashes or suspicious lesions. Neuro: Alert and oriented X 3. Moves all extremities spontaneously. Gait is normal. CNII-XII grossly in tact. Psych:  Responds to questions appropriately with a normal affect.   Labs: Results for orders placed or performed in visit on 05/15/15  POCT Microscopic Urinalysis ([facility])  Result Value Ref Range   WBC,UR,HPF,POC Few (A) None WBC/hpf   RBC,UR,HPF,POC None None RBC/hpf   Bacteria Few (A) None   Mucus Present (A) Absent   Epithelial Cells, UR Per Microscopy Few (A) None cells/hpf   Amorphous Small   POCT urinalysis dipstick  Result Value Ref Range   Color, UA yellow yellow   Clarity, UA clear clear   Glucose, UA negative negative   Bilirubin, UA negative negative   Ketones, POC UA negative negative   Spec Grav, UA 1.020    Blood, UA negative negative   pH, UA 7.0    Protein Ur, POC negative negative   Urobilinogen, UA 0.2    Nitrite, UA Negative Negative   Leukocytes, UA Negative Negative      ASSESSMENT AND PLAN:  54 y.o. year old female with acute back spasm pain This chart was  scribed in my presence and reviewed by me personally.    ICD-9-CM ICD-10-CM   1. Acute right flank pain 789.09 R10.11 POCT Microscopic Urinalysis ([facility])   338.19  POCT urinalysis dipstick     oxyCODONE-acetaminophen (ROXICET) 5-325 MG tablet     predniSONE (DELTASONE) 20 MG tablet     Signed, Elvina SidleKurt Lauenstein, MD   Signed, Elvina SidleKurt Lauenstein, MD 05/15/2015 2:11 PM

## 2015-05-15 NOTE — Patient Instructions (Signed)
The urine is negative. This appears to be a acute back spasm problem. The medicine prescribed should relieve the pain over the next 24 hours. Please let me know if pain is not being relieved and improving.

## 2015-05-17 ENCOUNTER — Ambulatory Visit (INDEPENDENT_AMBULATORY_CARE_PROVIDER_SITE_OTHER): Payer: BLUE CROSS/BLUE SHIELD | Admitting: Physician Assistant

## 2015-05-17 ENCOUNTER — Ambulatory Visit (INDEPENDENT_AMBULATORY_CARE_PROVIDER_SITE_OTHER): Payer: BLUE CROSS/BLUE SHIELD

## 2015-05-17 VITALS — BP 108/64 | HR 76 | Temp 97.5°F | Resp 16 | Ht 67.5 in | Wt 141.4 lb

## 2015-05-17 DIAGNOSIS — R1011 Right upper quadrant pain: Secondary | ICD-10-CM

## 2015-05-17 DIAGNOSIS — Z23 Encounter for immunization: Secondary | ICD-10-CM | POA: Diagnosis not present

## 2015-05-17 DIAGNOSIS — M545 Low back pain, unspecified: Secondary | ICD-10-CM

## 2015-05-17 DIAGNOSIS — R109 Unspecified abdominal pain: Secondary | ICD-10-CM

## 2015-05-17 MED ORDER — PREDNISONE 20 MG PO TABS: ORAL_TABLET | ORAL | Status: DC

## 2015-05-17 MED ORDER — CLONAZEPAM 1 MG PO TABS: 1.0000 mg | ORAL_TABLET | Freq: Every evening | ORAL | Status: DC | PRN

## 2015-05-17 MED ORDER — PREGABALIN 50 MG PO CAPS: 50.0000 mg | ORAL_CAPSULE | Freq: Every evening | ORAL | Status: DC | PRN

## 2015-05-17 MED ORDER — CYCLOBENZAPRINE HCL 10 MG PO TABS: 10.0000 mg | ORAL_TABLET | Freq: Three times a day (TID) | ORAL | Status: DC | PRN

## 2015-05-17 NOTE — Patient Instructions (Addendum)
Can take the lyrica samples for nerve pain. It can make you sleepy so we suggest trying it at night first and please plan to not drive or do anything strenuous. Also please do not take this medication with alcohol.  Start out 1 pill at night before bed, can increase to 2 pills at night before bed. Please call the office if you have any side effects.   How should I use this medicine? Take this medicine by mouth with a glass of water. Follow the directions on the prescription label. You can take this medicine with or without food. Take your doses at regular intervals. Do not take your medicine more often than directed. Do not stop taking except on your doctor's advice.  What if I miss a dose? If you miss a dose, take it as soon as you can. If it is almost time for your next dose, take only that dose. Do not take double or extra doses.  What should I watch for while using this medicine? Tell your doctor or healthcare professional if your symptoms do not start to get better or if they get worse.   You may get drowsy or dizzy. Do not drive, use machinery, or do anything that needs mental alertness until you know how this medicine affects you. Do not stand or sit up quickly, especially if you are an older patient. This reduces the risk of dizzy or fainting spells. Alcohol may interfere with the effect of this medicine. Avoid alcoholic drinks. If you have a heart condition, like congestive heart failure, and notice that you are retaining water and have swelling in your hands or feet, contact your health care provider immediately.  What side effects may I notice from receiving this medicine? Side effects that you should report to your doctor or health care professional as soon as possible and are very rare: -allergic reactions like skin rash, itching or hives, swelling of the face, lips, or tongue -breathing problems -changes in vision -jerking or unusual movements of any part of your body -suicidal  thoughts or other mood changes -swelling of the ankles, feet, hands -unusual bruising or bleeding  Side effects that usually do not require medical attention (Report these to your doctor or health care professional if they continue or are bothersome.): -dizziness -drowsiness -dry mouth -nausea -tremors     Sciatica With Rehab The sciatic nerve runs from the back down the leg and is responsible for sensation and control of the muscles in the back (posterior) side of the thigh, lower leg, and foot. Sciatica is a condition that is characterized by inflammation of this nerve.  SYMPTOMS   Signs of nerve damage, including numbness and/or weakness along the posterior side of the lower extremity.  Pain in the back of the thigh that may also travel down the leg.  Pain that worsens when sitting for long periods of time.  Occasionally, pain in the back or buttock. CAUSES  Inflammation of the sciatic nerve is the cause of sciatica. The inflammation is due to something irritating the nerve. Common sources of irritation include:  Sitting for long periods of time.  Direct trauma to the nerve.  Arthritis of the spine.  Herniated or ruptured disk.  Slipping of the vertebrae (spondylolisthesis).  Pressure from soft tissues, such as muscles or ligament-like tissue (fascia). RISK INCREASES WITH:  Sports that place pressure or stress on the spine (football or weightlifting).  Poor strength and flexibility.  Failure to warm up properly before activity.  Family history of low back pain or disk disorders.  Previous back injury or surgery.  Poor body mechanics, especially when lifting, or poor posture. PREVENTION   Warm up and stretch properly before activity.  Maintain physical fitness:  Strength, flexibility, and endurance.  Cardiovascular fitness.  Learn and use proper technique, especially with posture and lifting. When possible, have coach correct improper  technique.  Avoid activities that place stress on the spine. PROGNOSIS If treated properly, then sciatica usually resolves within 6 weeks. However, occasionally surgery is necessary.  RELATED COMPLICATIONS   Permanent nerve damage, including pain, numbness, tingle, or weakness.  Chronic back pain.  Risks of surgery: infection, bleeding, nerve damage, or damage to surrounding tissues. TREATMENT Treatment initially involves resting from any activities that aggravate your symptoms. The use of ice and medication may help reduce pain and inflammation. The use of strengthening and stretching exercises may help reduce pain with activity. These exercises may be performed at home or with referral to a therapist. A therapist may recommend further treatments, such as transcutaneous electronic nerve stimulation (TENS) or ultrasound. Your caregiver may recommend corticosteroid injections to help reduce inflammation of the sciatic nerve. If symptoms persist despite non-surgical (conservative) treatment, then surgery may be recommended. MEDICATION  If pain medication is necessary, then nonsteroidal anti-inflammatory medications, such as aspirin and ibuprofen, or other minor pain relievers, such as acetaminophen, are often recommended.  Do not take pain medication for 7 days before surgery.  Prescription pain relievers may be given if deemed necessary by your caregiver. Use only as directed and only as much as you need.  Ointments applied to the skin may be helpful.  Corticosteroid injections may be given by your caregiver. These injections should be reserved for the most serious cases, because they may only be given a certain number of times. HEAT AND COLD  Cold treatment (icing) relieves pain and reduces inflammation. Cold treatment should be applied for 10 to 15 minutes every 2 to 3 hours for inflammation and pain and immediately after any activity that aggravates your symptoms. Use ice packs or  massage the area with a piece of ice (ice massage).  Heat treatment may be used prior to performing the stretching and strengthening activities prescribed by your caregiver, physical therapist, or athletic trainer. Use a heat pack or soak the injury in warm water. SEEK MEDICAL CARE IF:  Treatment seems to offer no benefit, or the condition worsens.  Any medications produce adverse side effects. EXERCISES  RANGE OF MOTION (ROM) AND STRETCHING EXERCISES - Sciatica Most people with sciatic will find that their symptoms worsen with either excessive bending forward (flexion) or arching at the low back (extension). The exercises which will help resolve your symptoms will focus on the opposite motion. Your physician, physical therapist or athletic trainer will help you determine which exercises will be most helpful to resolve your low back pain. Do not complete any exercises without first consulting with your clinician. Discontinue any exercises which worsen your symptoms until you speak to your clinician. If you have pain, numbness or tingling which travels down into your buttocks, leg or foot, the goal of the therapy is for these symptoms to move closer to your back and eventually resolve. Occasionally, these leg symptoms will get better, but your low back pain may worsen; this is typically an indication of progress in your rehabilitation. Be certain to be very alert to any changes in your symptoms and the activities in which you participated in the 24  hours prior to the change. Sharing this information with your clinician will allow him/her to most efficiently treat your condition. These exercises may help you when beginning to rehabilitate your injury. Your symptoms may resolve with or without further involvement from your physician, physical therapist or athletic trainer. While completing these exercises, remember:   Restoring tissue flexibility helps normal motion to return to the joints. This allows  healthier, less painful movement and activity.  An effective stretch should be held for at least 30 seconds.  A stretch should never be painful. You should only feel a gentle lengthening or release in the stretched tissue. FLEXION RANGE OF MOTION AND STRETCHING EXERCISES: STRETCH - Flexion, Single Knee to Chest   Lie on a firm bed or floor with both legs extended in front of you.  Keeping one leg in contact with the floor, bring your opposite knee to your chest. Hold your leg in place by either grabbing behind your thigh or at your knee.  Pull until you feel a gentle stretch in your low back. Hold __________ seconds.  Slowly release your grasp and repeat the exercise with the opposite side. Repeat __________ times. Complete this exercise __________ times per day.  STRETCH - Flexion, Double Knee to Chest  Lie on a firm bed or floor with both legs extended in front of you.  Keeping one leg in contact with the floor, bring your opposite knee to your chest.  Tense your stomach muscles to support your back and then lift your other knee to your chest. Hold your legs in place by either grabbing behind your thighs or at your knees.  Pull both knees toward your chest until you feel a gentle stretch in your low back. Hold __________ seconds.  Tense your stomach muscles and slowly return one leg at a time to the floor. Repeat __________ times. Complete this exercise __________ times per day.  STRETCH - Low Trunk Rotation   Lie on a firm bed or floor. Keeping your legs in front of you, bend your knees so they are both pointed toward the ceiling and your feet are flat on the floor.  Extend your arms out to the side. This will stabilize your upper body by keeping your shoulders in contact with the floor.  Gently and slowly drop both knees together to one side until you feel a gentle stretch in your low back. Hold for __________ seconds.  Tense your stomach muscles to support your low back as  you bring your knees back to the starting position. Repeat the exercise to the other side. Repeat __________ times. Complete this exercise __________ times per day  EXTENSION RANGE OF MOTION AND FLEXIBILITY EXERCISES: STRETCH - Extension, Prone on Elbows  Lie on your stomach on the floor, a bed will be too soft. Place your palms about shoulder width apart and at the height of your head.  Place your elbows under your shoulders. If this is too painful, stack pillows under your chest.  Allow your body to relax so that your hips drop lower and make contact more completely with the floor.  Hold this position for __________ seconds.  Slowly return to lying flat on the floor. Repeat __________ times. Complete this exercise __________ times per day.  RANGE OF MOTION - Extension, Prone Press Ups  Lie on your stomach on the floor, a bed will be too soft. Place your palms about shoulder width apart and at the height of your head.  Keeping your back as  relaxed as possible, slowly straighten your elbows while keeping your hips on the floor. You may adjust the placement of your hands to maximize your comfort. As you gain motion, your hands will come more underneath your shoulders.  Hold this position __________ seconds.  Slowly return to lying flat on the floor. Repeat __________ times. Complete this exercise __________ times per day.  STRENGTHENING EXERCISES - Sciatica  These exercises may help you when beginning to rehabilitate your injury. These exercises should be done near your "sweet spot." This is the neutral, low-back arch, somewhere between fully rounded and fully arched, that is your least painful position. When performed in this safe range of motion, these exercises can be used for people who have either a flexion or extension based injury. These exercises may resolve your symptoms with or without further involvement from your physician, physical therapist or athletic trainer. While completing  these exercises, remember:   Muscles can gain both the endurance and the strength needed for everyday activities through controlled exercises.  Complete these exercises as instructed by your physician, physical therapist or athletic trainer. Progress with the resistance and repetition exercises only as your caregiver advises.  You may experience muscle soreness or fatigue, but the pain or discomfort you are trying to eliminate should never worsen during these exercises. If this pain does worsen, stop and make certain you are following the directions exactly. If the pain is still present after adjustments, discontinue the exercise until you can discuss the trouble with your clinician. STRENGTHENING - Deep Abdominals, Pelvic Tilt   Lie on a firm bed or floor. Keeping your legs in front of you, bend your knees so they are both pointed toward the ceiling and your feet are flat on the floor.  Tense your lower abdominal muscles to press your low back into the floor. This motion will rotate your pelvis so that your tail bone is scooping upwards rather than pointing at your feet or into the floor.  With a gentle tension and even breathing, hold this position for __________ seconds. Repeat __________ times. Complete this exercise __________ times per day.  STRENGTHENING - Abdominals, Crunches   Lie on a firm bed or floor. Keeping your legs in front of you, bend your knees so they are both pointed toward the ceiling and your feet are flat on the floor. Cross your arms over your chest.  Slightly tip your chin down without bending your neck.  Tense your abdominals and slowly lift your trunk high enough to just clear your shoulder blades. Lifting higher can put excessive stress on the low back and does not further strengthen your abdominal muscles.  Control your return to the starting position. Repeat __________ times. Complete this exercise __________ times per day.  STRENGTHENING - Quadruped, Opposite  UE/LE Lift  Assume a hands and knees position on a firm surface. Keep your hands under your shoulders and your knees under your hips. You may place padding under your knees for comfort.  Find your neutral spine and gently tense your abdominal muscles so that you can maintain this position. Your shoulders and hips should form a rectangle that is parallel with the floor and is not twisted.  Keeping your trunk steady, lift your right hand no higher than your shoulder and then your left leg no higher than your hip. Make sure you are not holding your breath. Hold this position __________ seconds.  Continuing to keep your abdominal muscles tense and your back steady, slowly return to  your starting position. Repeat with the opposite arm and leg. Repeat __________ times. Complete this exercise __________ times per day.  STRENGTHENING - Abdominals and Quadriceps, Straight Leg Raise   Lie on a firm bed or floor with both legs extended in front of you.  Keeping one leg in contact with the floor, bend the other knee so that your foot can rest flat on the floor.  Find your neutral spine, and tense your abdominal muscles to maintain your spinal position throughout the exercise.  Slowly lift your straight leg off the floor about 6 inches for a count of 15, making sure to not hold your breath.  Still keeping your neutral spine, slowly lower your leg all the way to the floor. Repeat this exercise with each leg __________ times. Complete this exercise __________ times per day. POSTURE AND BODY MECHANICS CONSIDERATIONS - Sciatica Keeping correct posture when sitting, standing or completing your activities will reduce the stress put on different body tissues, allowing injured tissues a chance to heal and limiting painful experiences. The following are general guidelines for improved posture. Your physician or physical therapist will provide you with any instructions specific to your needs. While reading these  guidelines, remember:  The exercises prescribed by your provider will help you have the flexibility and strength to maintain correct postures.  The correct posture provides the optimal environment for your joints to work. All of your joints have less wear and tear when properly supported by a spine with good posture. This means you will experience a healthier, less painful body.  Correct posture must be practiced with all of your activities, especially prolonged sitting and standing. Correct posture is as important when doing repetitive low-stress activities (typing) as it is when doing a single heavy-load activity (lifting). RESTING POSITIONS Consider which positions are most painful for you when choosing a resting position. If you have pain with flexion-based activities (sitting, bending, stooping, squatting), choose a position that allows you to rest in a less flexed posture. You would want to avoid curling into a fetal position on your side. If your pain worsens with extension-based activities (prolonged standing, working overhead), avoid resting in an extended position such as sleeping on your stomach. Most people will find more comfort when they rest with their spine in a more neutral position, neither too rounded nor too arched. Lying on a non-sagging bed on your side with a pillow between your knees, or on your back with a pillow under your knees will often provide some relief. Keep in mind, being in any one position for a prolonged period of time, no matter how correct your posture, can still lead to stiffness. PROPER SITTING POSTURE In order to minimize stress and discomfort on your spine, you must sit with correct posture Sitting with good posture should be effortless for a healthy body. Returning to good posture is a gradual process. Many people can work toward this most comfortably by using various supports until they have the flexibility and strength to maintain this posture on their  own. When sitting with proper posture, your ears will fall over your shoulders and your shoulders will fall over your hips. You should use the back of the chair to support your upper back. Your low back will be in a neutral position, just slightly arched. You may place a small pillow or folded towel at the base of your low back for support.  When working at a desk, create an environment that supports good, upright posture. Without  extra support, muscles fatigue and lead to excessive strain on joints and other tissues. Keep these recommendations in mind: CHAIR:   A chair should be able to slide under your desk when your back makes contact with the back of the chair. This allows you to work closely.  The chair's height should allow your eyes to be level with the upper part of your monitor and your hands to be slightly lower than your elbows. BODY POSITION  Your feet should make contact with the floor. If this is not possible, use a foot rest.  Keep your ears over your shoulders. This will reduce stress on your neck and low back. INCORRECT SITTING POSTURES   If you are feeling tired and unable to assume a healthy sitting posture, do not slouch or slump. This puts excessive strain on your back tissues, causing more damage and pain. Healthier options include:  Using more support, like a lumbar pillow.  Switching tasks to something that requires you to be upright or walking.  Talking a brief walk.  Lying down to rest in a neutral-spine position. PROLONGED STANDING WHILE SLIGHTLY LEANING FORWARD  When completing a task that requires you to lean forward while standing in one place for a long time, place either foot up on a stationary 2-4 inch high object to help maintain the best posture. When both feet are on the ground, the low back tends to lose its slight inward curve. If this curve flattens (or becomes too large), then the back and your other joints will experience too much stress, fatigue more  quickly and can cause pain.  CORRECT STANDING POSTURES Proper standing posture should be assumed with all daily activities, even if they only take a few moments, like when brushing your teeth. As in sitting, your ears should fall over your shoulders and your shoulders should fall over your hips. You should keep a slight tension in your abdominal muscles to brace your spine. Your tailbone should point down to the ground, not behind your body, resulting in an over-extended swayback posture.  INCORRECT STANDING POSTURES  Common incorrect standing postures include a forward head, locked knees and/or an excessive swayback. WALKING Walk with an upright posture. Your ears, shoulders and hips should all line-up. PROLONGED ACTIVITY IN A FLEXED POSITION When completing a task that requires you to bend forward at your waist or lean over a low surface, try to find a way to stabilize 3 of 4 of your limbs. You can place a hand or elbow on your thigh or rest a knee on the surface you are reaching across. This will provide you more stability so that your muscles do not fatigue as quickly. By keeping your knees relaxed, or slightly bent, you will also reduce stress across your low back. CORRECT LIFTING TECHNIQUES DO :   Assume a wide stance. This will provide you more stability and the opportunity to get as close as possible to the object which you are lifting.  Tense your abdominals to brace your spine; then bend at the knees and hips. Keeping your back locked in a neutral-spine position, lift using your leg muscles. Lift with your legs, keeping your back straight.  Test the weight of unknown objects before attempting to lift them.  Try to keep your elbows locked down at your sides in order get the best strength from your shoulders when carrying an object.  Always ask for help when lifting heavy or awkward objects. INCORRECT LIFTING TECHNIQUES DO NOT:  Lock your knees when lifting, even if it is a small  object.  Bend and twist. Pivot at your feet or move your feet when needing to change directions.  Assume that you cannot safely pick up a paperclip without proper posture.   This information is not intended to replace advice given to you by your health care provider. Make sure you discuss any questions you have with your health care provider.   Document Released: 07/17/2005 Document Revised: 12/01/2014 Document Reviewed: 10/29/2008 [employer] Interactive Patient Education [employer]

## 2015-05-17 NOTE — Progress Notes (Signed)
Subjective:    Patient ID: Carla Morton, female    DOB: [birth year], 54 y.o.   MRN: [id number]  HPI 54 y.o. 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 She had a remote injury in her 92's with her back, "tore a muscle". She was getting in and out of her bucket truck on Tuesday, then she was in an attic bent over for about 2 hours, unloaded her truck Thursday, then Friday she had very bad back pain. Had ice pack, went to urgent care, had normal urine and given oxycodone. She has right lower back pain/buttocks with some pain straight through to her RLQ in her AB. Better with lying down, worse with movement. Has been using heat and oxycodone and prednisone (has been on 2 Sat, 2 Sun, and 2 today. Patient denies fever, hematuria, incontinence, numbness, tingling, weakness and saddle anesthesia   Blood pressure 108/64, pulse 76, temperature 97.5 F (36.4 C), resp. rate 16, height 5' 7.5" (1.715 m), weight 141 lb 6.4 oz (64.139 kg).  Current Outpatient Prescriptions on File Prior to Visit  Medication Sig Dispense Refill  . B Complex-C (SUPER B COMPLEX PO) Take 1 tablet by mouth daily.    . Cetirizine HCl (ZYRTEC ALLERGY PO) Take 1 tablet by mouth daily.    . Cholecalciferol (VITAMIN D3) 2000 UNITS TABS Take 1 tablet by mouth daily.    Marland Kitchen estradiol-levonorgestrel (CLIMARAPRO) 0.045-0.015 MG/DAY Place 1 patch onto the skin once a week. Swapped out on Wednesdays.    . Multiple Vitamins-Minerals (CENTRUM SILVER PO) Take 1 tablet by mouth daily.    Marland Kitchen oxyCODONE-acetaminophen (ROXICET) 5-325 MG tablet Take 1 tablet by mouth every 8 (eight) hours as needed for severe pain. 20 tablet 0  . predniSONE (DELTASONE) 20 MG tablet Two daily with food 10 tablet 0  . Probiotic Product (PROBIOTIC DAILY PO) Take by mouth daily. Digestive Advantage    . Turmeric 500 MG CAPS Take 1 tablet by mouth daily.     No current facility-administered medications on file prior to visit.     Review of Systems  Constitutional: Negative.   HENT: Negative.   Respiratory: Negative.   Cardiovascular: Negative.   Gastrointestinal: Negative.   Genitourinary: Negative for dysuria, urgency, frequency, flank pain, decreased urine volume, vaginal bleeding, vaginal discharge, enuresis, genital sores, vaginal pain, menstrual problem and pelvic pain.  Musculoskeletal: Positive for back pain. Negative for myalgias, joint swelling, arthralgias, gait problem, neck pain and neck stiffness.  Skin: Negative.  Negative for rash.  Neurological: Negative.   Psychiatric/Behavioral: Negative.        Objective:   Physical Exam  Constitutional: She is oriented to person, place, and time. She appears well-developed and well-nourished.  HENT:  Head: Normocephalic and atraumatic.  Eyes: Conjunctivae are normal. Pupils are equal, round, and reactive to light.  Neck: Normal range of motion. Neck supple.  Cardiovascular: Normal rate and regular rhythm.   Pulmonary/Chest: Effort normal and breath sounds normal.  Abdominal: Soft. Bowel sounds are normal. She exhibits no mass. There is no tenderness. There is no rebound and no guarding.  Musculoskeletal:  Patient is able to ambulate well. Gait is  Antalgic. Straight leg raising with dorsiflexion negative bilaterally for radicular symptoms. Sensory exam in the legs are normal. Knee reflexes are normal Ankle reflexes are normal Strength is normal and symmetric in arms and legs. There is SI tenderness to palpation.  There isparaspinal muscle spasm.  There is not midline  tenderness.  ROM of spine with  limited in all spheres due to pain.   Lymphadenopathy:    She has no cervical adenopathy.  Neurological: She is alert and oriented to person, place, and time. She has normal reflexes.  Skin: Skin is warm and dry. No rash noted.      Assessment & Plan:  Lower back pain- negative straight leg- left back Prednisone was prescribed,NSAIDs, RICE, lyrica  samples, and exercise given If not better follow up in office or will refer to PT/orthopedics. Natural history and expected course discussed. Questions answered. Proper lifting, bending technique discussed. Stretching exercises discussed. Heat to affected area as needed for local pain relief. OTC analgesics as needed. Muscle relaxants per medication orders.

## 2015-08-10 ENCOUNTER — Ambulatory Visit (INDEPENDENT_AMBULATORY_CARE_PROVIDER_SITE_OTHER): Payer: BLUE CROSS/BLUE SHIELD | Admitting: Internal Medicine

## 2015-08-10 ENCOUNTER — Encounter: Payer: Self-pay | Admitting: Internal Medicine

## 2015-08-10 VITALS — BP 100/58 | HR 76 | Temp 98.6°F | Resp 16 | Ht 67.5 in | Wt 139.0 lb

## 2015-08-10 DIAGNOSIS — J069 Acute upper respiratory infection, unspecified: Secondary | ICD-10-CM | POA: Diagnosis not present

## 2015-08-10 MED ORDER — PROMETHAZINE-DM 6.25-15 MG/5ML PO SYRP: ORAL_SOLUTION | ORAL | Status: DC

## 2015-08-10 MED ORDER — DOXYCYCLINE HYCLATE 100 MG PO CAPS: 100.0000 mg | ORAL_CAPSULE | Freq: Two times a day (BID) | ORAL | Status: DC

## 2015-08-10 MED ORDER — PREDNISONE 20 MG PO TABS: ORAL_TABLET | ORAL | Status: DC

## 2015-08-10 NOTE — Progress Notes (Signed)
Patient ID: Carla MainlandJanice Morton, female   DOB: 1961/04/07, 55 y.o.   MRN: [id number]  HPI  Patient presents to the office for evaluation of cough.  It has been going on for 1 weeks.  Patient reports night > day, wet, worse with lying down.  They also endorse change in voice, chills, postnasal drip, shortness of breath, sputum production, wheezing and sore throat, headache, sinus pressure, ear pain.  .  They have tried mucinex, zyrtec, ibuprofen.  They report that nothing has worked.  They denies other sick contacts.  Review of Systems  Constitutional: Positive for malaise/fatigue. Negative for fever and chills.  HENT: Positive for congestion, ear pain and sore throat.   Respiratory: Positive for cough, sputum production, shortness of breath and wheezing.   Cardiovascular: Negative for chest pain, palpitations and leg swelling.  Neurological: Positive for headaches.    PE:  Filed Vitals:   08/10/15 1446  BP: 100/58  Pulse: 76  Temp: 98.6 F (37 C)  Resp: 16   General:  Alert and non-toxic, WDWN, NAD HEENT: NCAT, PERLA, EOM normal, no occular discharge or erythema.  Nasal mucosal edema with sinus tenderness to palpation.  Oropharynx clear with minimal oropharyngeal edema and erythema.  Mucous membranes moist and pink. Neck:  Cervical adenopathy Chest:  RRR no MRGs.  Lungs clear to auscultation A&P with no wheezes rhonchi or rales.   Abdomen: +BS x 4 quadrants, soft, non-tender, no guarding, rigidity, or rebound. Skin: warm and dry no rash Neuro: A&Ox4, CN II-XII grossly intact  Assessment and Plan:   1. Acute URI -nasal saline -flonase -cont zyrtec - predniSONE (DELTASONE) 20 MG tablet; 3 tabs po daily x 3 days, then 2 tabs x 3 days, then 1.5 tabs x 3 days, then 1 tab x 3 days, then 0.5 tabs x 3 days  Dispense: 27 tablet; Refill: 0 - doxycycline (VIBRAMYCIN) 100 MG capsule; Take 1 capsule (100 mg total) by mouth 2 (two) times daily. One po bid x 7 days  Dispense: 14 capsule; Refill: 0 -  promethazine-dextromethorphan (PROMETHAZINE-DM) 6.25-15 MG/5ML syrup; Take 5-10 mL PO q8hrs prn for severe coughing  Dispense: 360 mL; Refill: 1

## 2015-08-26 ENCOUNTER — Telehealth: Payer: Self-pay | Admitting: Internal Medicine

## 2015-08-26 DIAGNOSIS — J0140 Acute pansinusitis, unspecified: Secondary | ICD-10-CM

## 2015-08-26 MED ORDER — AMOXICILLIN-POT CLAVULANATE 875-125 MG PO TABS: 1.0000 | ORAL_TABLET | Freq: Two times a day (BID) | ORAL | Status: DC

## 2015-08-26 NOTE — Telephone Encounter (Signed)
Patient states she sees Dr. Annalee Genta (ENT) so please do not schedule ov with Dr. Ezzard Standing at this time.  Thanks.

## 2015-08-26 NOTE — Telephone Encounter (Signed)
Patient calling complaining of sinus pressure and headache that is very severe.  She is still having yellow mucous despite having finished a week of doxycycline and prednisone.  Will refer to Dr. Ezzard Standing.  Cont supportive measures and will start on augmentin.

## 2015-09-13 DIAGNOSIS — J0140 Acute pansinusitis, unspecified: Secondary | ICD-10-CM | POA: Insufficient documentation

## 2015-09-13 DIAGNOSIS — J342 Deviated nasal septum: Secondary | ICD-10-CM | POA: Insufficient documentation

## 2015-09-13 DIAGNOSIS — J302 Other seasonal allergic rhinitis: Secondary | ICD-10-CM | POA: Insufficient documentation

## 2015-09-22 ENCOUNTER — Ambulatory Visit (INDEPENDENT_AMBULATORY_CARE_PROVIDER_SITE_OTHER): Payer: BLUE CROSS/BLUE SHIELD | Admitting: Physician Assistant

## 2015-09-22 ENCOUNTER — Encounter: Payer: Self-pay | Admitting: Physician Assistant

## 2015-09-22 VITALS — BP 108/60 | HR 78 | Temp 97.5°F | Resp 16 | Ht 69.0 in | Wt 143.0 lb

## 2015-09-22 DIAGNOSIS — R7303 Prediabetes: Secondary | ICD-10-CM | POA: Diagnosis not present

## 2015-09-22 DIAGNOSIS — D649 Anemia, unspecified: Secondary | ICD-10-CM

## 2015-09-22 DIAGNOSIS — Z139 Encounter for screening, unspecified: Secondary | ICD-10-CM | POA: Diagnosis not present

## 2015-09-22 DIAGNOSIS — E559 Vitamin D deficiency, unspecified: Secondary | ICD-10-CM

## 2015-09-22 DIAGNOSIS — J342 Deviated nasal septum: Secondary | ICD-10-CM | POA: Diagnosis not present

## 2015-09-22 DIAGNOSIS — Z79899 Other long term (current) drug therapy: Secondary | ICD-10-CM | POA: Diagnosis not present

## 2015-09-22 DIAGNOSIS — R6889 Other general symptoms and signs: Secondary | ICD-10-CM | POA: Diagnosis not present

## 2015-09-22 DIAGNOSIS — J302 Other seasonal allergic rhinitis: Secondary | ICD-10-CM | POA: Diagnosis not present

## 2015-09-22 DIAGNOSIS — Z0001 Encounter for general adult medical examination with abnormal findings: Secondary | ICD-10-CM

## 2015-09-22 DIAGNOSIS — E785 Hyperlipidemia, unspecified: Secondary | ICD-10-CM | POA: Diagnosis not present

## 2015-09-22 DIAGNOSIS — N898 Other specified noninflammatory disorders of vagina: Secondary | ICD-10-CM

## 2015-09-22 DIAGNOSIS — Z1389 Encounter for screening for other disorder: Secondary | ICD-10-CM

## 2015-09-22 LAB — CBC WITH DIFFERENTIAL/PLATELET
Basophils Absolute: 0 10*3/uL (ref 0.0–0.1)
Basophils Relative: 0 % (ref 0–1)
Eosinophils Absolute: 0.1 10*3/uL (ref 0.0–0.7)
Eosinophils Relative: 2 % (ref 0–5)
HEMATOCRIT: 39.4 % (ref 36.0–46.0)
HEMOGLOBIN: 13.4 g/dL (ref 12.0–15.0)
LYMPHS PCT: 33 % (ref 12–46)
Lymphs Abs: 1.7 10*3/uL (ref 0.7–4.0)
MCH: 32.3 pg (ref 26.0–34.0)
MCHC: 34 g/dL (ref 30.0–36.0)
MCV: 94.9 fL (ref 78.0–100.0)
MONO ABS: 0.7 10*3/uL (ref 0.1–1.0)
MONOS PCT: 13 % — AB (ref 3–12)
MPV: 9.4 fL (ref 8.6–12.4)
NEUTROS ABS: 2.6 10*3/uL (ref 1.7–7.7)
Neutrophils Relative %: 52 % (ref 43–77)
Platelets: 250 10*3/uL (ref 150–400)
RBC: 4.15 MIL/uL (ref 3.87–5.11)
RDW: 13.3 % (ref 11.5–15.5)
WBC: 5 10*3/uL (ref 4.0–10.5)

## 2015-09-22 MED ORDER — HYLAFEM VA SUPP: 1.0000 | VAGINAL | Status: DC

## 2015-09-22 NOTE — Patient Instructions (Addendum)
Call your insurance about your shingles vaccine  Preventive Care for Adults  A healthy lifestyle and preventive care can promote health and wellness. Preventive health guidelines for women include the following key practices.  A routine yearly physical is a good way to check with your health care provider about your health and preventive screening. It is a chance to share any concerns and updates on your health and to receive a thorough exam.  Visit your dentist for a routine exam and preventive care every 6 months. Brush your teeth twice a day and floss once a day. Good oral hygiene prevents tooth decay and gum disease.  The frequency of eye exams is based on your age, health, family medical history, use of contact lenses, and other factors. Follow your health care provider's recommendations for frequency of eye exams.  Eat a healthy diet. Foods like vegetables, fruits, whole grains, low-fat dairy products, and lean protein foods contain the nutrients you need without too many calories. Decrease your intake of foods high in solid fats, added sugars, and salt. Eat the right amount of calories for you.Get information about a proper diet from your health care provider, if necessary.  Regular physical exercise is one of the most important things you can do for your health. Most adults should get at least 150 minutes of moderate-intensity exercise (any activity that increases your heart rate and causes you to sweat) each week. In addition, most adults need muscle-strengthening exercises on 2 or more days a week.  Maintain a healthy weight. The body mass index (BMI) is a screening tool to identify possible weight problems. It provides an estimate of body fat based on height and weight. Your health care provider can find your BMI and can help you achieve or maintain a healthy weight.For adults 20 years and older:  A BMI below 18.5 is considered underweight.  A BMI of 18.5 to 24.9 is normal.  A BMI  of 25 to 29.9 is considered overweight.  A BMI of 30 and above is considered obese.  Maintain normal blood lipids and cholesterol levels by exercising and minimizing your intake of saturated fat. Eat a balanced diet with plenty of fruit and vegetables. Blood tests for lipids and cholesterol should begin at age 52 and be repeated every 5 years. If your lipid or cholesterol levels are high, you are over 50, or you are at high risk for heart disease, you may need your cholesterol levels checked more frequently.Ongoing high lipid and cholesterol levels should be treated with medicines if diet and exercise are not working.  If you smoke, find out from your health care provider how to quit. If you do not use tobacco, do not start.  Lung cancer screening is recommended for adults aged 60-80 years who are at high risk for developing lung cancer because of a history of smoking. A yearly low-dose CT scan of the lungs is recommended for people who have at least a 30-pack-year history of smoking and are a current smoker or have quit within the past 15 years. A pack year of smoking is smoking an average of 1 pack of cigarettes a day for 1 year (for example: 1 pack a day for 30 years or 2 packs a day for 15 years). Yearly screening should continue until the smoker has stopped smoking for at least 15 years. Yearly screening should be stopped for people who develop a health problem that would prevent them from having lung cancer treatment.  High blood pressure  causes heart disease and increases the risk of stroke. Your blood pressure should be checked at least every 1 to 2 years. Ongoing high blood pressure should be treated with medicines if weight loss and exercise do not work.  If you are 30-27 years old, ask your health care provider if you should take aspirin to prevent strokes.  Diabetes screening involves taking a blood sample to check your fasting blood sugar level. This should be done once every 3 years,  after age 23, if you are within normal weight and without risk factors for diabetes. Testing should be considered at a younger age or be carried out more frequently if you are overweight and have at least 1 risk factor for diabetes.  Breast cancer screening is essential preventive care for women. You should practice "breast self-awareness." This means understanding the normal appearance and feel of your breasts and may include breast self-examination. Any changes detected, no matter how small, should be reported to a health care provider. Women in their 61s and 30s should have a clinical breast exam (CBE) by a health care provider as part of a regular health exam every 1 to 3 years. After age 34, women should have a CBE every year. Starting at age 63, women should consider having a mammogram (breast X-ray test) every year. Women who have a family history of breast cancer should talk to their health care provider about genetic screening. Women at a high risk of breast cancer should talk to their health care providers about having an MRI and a mammogram every year.  Breast cancer gene (BRCA)-related cancer risk assessment is recommended for women who have family members with BRCA-related cancers. BRCA-related cancers include breast, ovarian, tubal, and peritoneal cancers. Having family members with these cancers may be associated with an increased risk for harmful changes (mutations) in the breast cancer genes BRCA1 and BRCA2. Results of the assessment will determine the need for genetic counseling and BRCA1 and BRCA2 testing.  Routine pelvic exams to screen for cancer are no longer recommended for nonpregnant women who are considered low risk for cancer of the pelvic organs (ovaries, uterus, and vagina) and who do not have symptoms. Ask your health care provider if a screening pelvic exam is right for you.  If you have had past treatment for cervical cancer or a condition that could lead to cancer, you need  Pap tests and screening for cancer for at least 20 years after your treatment. If Pap tests have been discontinued, your risk factors (such as having a new sexual partner) need to be reassessed to determine if screening should be resumed. Some women have medical problems that increase the chance of getting cervical cancer. In these cases, your health care provider may recommend more frequent screening and Pap tests.  Colorectal cancer can be detected and often prevented. Most routine colorectal cancer screening begins at the age of 43 years and continues through age 63 years. However, your health care provider may recommend screening at an earlier age if you have risk factors for colon cancer. On a yearly basis, your health care provider may provide home test kits to check for hidden blood in the stool. Use of a small camera at the end of a tube, to directly examine the colon (sigmoidoscopy or colonoscopy), can detect the earliest forms of colorectal cancer. Talk to your health care provider about this at age 9, when routine screening begins. Direct exam of the colon should be repeated every 5-10 years through  age 67 years, unless early forms of pre-cancerous polyps or small growths are found.  Hepatitis C blood testing is recommended for all people born from [birth year] through 1965 and any individual with known risks for hepatitis C.  Pra  Osteoporosis is a disease in which the bones lose minerals and strength with aging. This can result in serious bone fractures or breaks. The risk of osteoporosis can be identified using a bone density scan. Women ages 75 years and over and women at risk for fractures or osteoporosis should discuss screening with their health care providers. Ask your health care provider whether you should take a calcium supplement or vitamin D to reduce the rate of osteoporosis.  Menopause can be associated with physical symptoms and risks. Hormone replacement therapy is available to  decrease symptoms and risks. You should talk to your health care provider about whether hormone replacement therapy is right for you.  Use sunscreen. Apply sunscreen liberally and repeatedly throughout the day. You should seek shade when your shadow is shorter than you. Protect yourself by wearing long sleeves, pants, a wide-brimmed hat, and sunglasses year round, whenever you are outdoors.  Once a month, do a whole body skin exam, using a mirror to look at the skin on your back. Tell your health care provider of new moles, moles that have irregular borders, moles that are larger than a pencil eraser, or moles that have changed in shape or color.  Stay current with required vaccines (immunizations).  Influenza vaccine. All adults should be immunized every year.  Tetanus, diphtheria, and acellular pertussis (Td, Tdap) vaccine. Pregnant women should receive 1 dose of Tdap vaccine during each pregnancy. The dose should be obtained regardless of the length of time since the last dose. Immunization is preferred during the 27th-36th week of gestation. An adult who has not previously received Tdap or who does not know her vaccine status should receive 1 dose of Tdap. This initial dose should be followed by tetanus and diphtheria toxoids (Td) booster doses every 10 years. Adults with an unknown or incomplete history of completing a 3-dose immunization series with Td-containing vaccines should begin or complete a primary immunization series including a Tdap dose. Adults should receive a Td booster every 10 years.  Varicella vaccine. An adult without evidence of immunity to varicella should receive 2 doses or a second dose if she has previously received 1 dose. Pregnant females who do not have evidence of immunity should receive the first dose after pregnancy. This first dose should be obtained before leaving the health care facility. The second dose should be obtained 4-8 weeks after the first dose.  Human  papillomavirus (HPV) vaccine. Females aged 13-26 years who have not received the vaccine previously should obtain the 3-dose series. The vaccine is not recommended for use in pregnant females. However, pregnancy testing is not needed before receiving a dose. If a female is found to be pregnant after receiving a dose, no treatment is needed. In that case, the remaining doses should be delayed until after the pregnancy. Immunization is recommended for any person with an immunocompromised condition through the age of 26 years if she did not get any or all doses earlier. During the 3-dose series, the second dose should be obtained 4-8 weeks after the first dose. The third dose should be obtained 24 weeks after the first dose and 16 weeks after the second dose.  Zoster vaccine. One dose is recommended for adults aged 42 years or older unless certain  conditions are present.  Measles, mumps, and rubella (MMR) vaccine. Adults born before 22 generally are considered immune to measles and mumps. Adults born in [birth year] or later should have 1 or more doses of MMR vaccine unless there is a contraindication to the vaccine or there is laboratory evidence of immunity to each of the three diseases. A routine second dose of MMR vaccine should be obtained at least 28 days after the first dose for students attending postsecondary schools, health care workers, or international travelers. People who received inactivated measles vaccine or an unknown type of measles vaccine during 1963-1967 should receive 2 doses of MMR vaccine. People who received inactivated mumps vaccine or an unknown type of mumps vaccine before 1979 and are at high risk for mumps infection should consider immunization with 2 doses of MMR vaccine. For females of childbearing age, rubella immunity should be determined. If there is no evidence of immunity, females who are not pregnant should be vaccinated. If there is no evidence of immunity, females who are pregnant  should delay immunization until after pregnancy. Unvaccinated health care workers born before [birth year] who lack laboratory evidence of measles, mumps, or rubella immunity or laboratory confirmation of disease should consider measles and mumps immunization with 2 doses of MMR vaccine or rubella immunization with 1 dose of MMR vaccine.  Pneumococcal 13-valent conjugate (PCV13) vaccine. When indicated, a person who is uncertain of her immunization history and has no record of immunization should receive the PCV13 vaccine. An adult aged 82 years or older who has certain medical conditions and has not been previously immunized should receive 1 dose of PCV13 vaccine. This PCV13 should be followed with a dose of pneumococcal polysaccharide (PPSV23) vaccine. The PPSV23 vaccine dose should be obtained at least 8 weeks after the dose of PCV13 vaccine. An adult aged 73 years or older who has certain medical conditions and previously received 1 or more doses of PPSV23 vaccine should receive 1 dose of PCV13. The PCV13 vaccine dose should be obtained 1 or more years after the last PPSV23 vaccine dose.    Pneumococcal polysaccharide (PPSV23) vaccine. When PCV13 is also indicated, PCV13 should be obtained first. All adults aged 87 years and older should be immunized. An adult younger than age 52 years who has certain medical conditions should be immunized. Any person who resides in a nursing home or long-term care facility should be immunized. An adult smoker should be immunized. People with an immunocompromised condition and certain other conditions should receive both PCV13 and PPSV23 vaccines. People with human immunodeficiency virus (HIV) infection should be immunized as soon as possible after diagnosis. Immunization during chemotherapy or radiation therapy should be avoided. Routine use of PPSV23 vaccine is not recommended for American Indians, [place] Natives, or people younger than 65 years unless there are medical  conditions that require PPSV23 vaccine. When indicated, people who have unknown immunization and have no record of immunization should receive PPSV23 vaccine. One-time revaccination 5 years after the first dose of PPSV23 is recommended for people aged 19-64 years who have chronic kidney failure, nephrotic syndrome, asplenia, or immunocompromised conditions. People who received 1-2 doses of PPSV23 before age 40 years should receive another dose of PPSV23 vaccine at age 81 years or later if at least 5 years have passed since the previous dose. Doses of PPSV23 are not needed for people immunized with PPSV23 at or after age 58 years.  Preventive Services / Frequency   Ages 67 to 25 years  Blood pressure  check.  Lipid and cholesterol check.  Lung cancer screening. / Every year if you are aged 6-80 years and have a 30-pack-year history of smoking and currently smoke or have quit within the past 15 years. Yearly screening is stopped once you have quit smoking for at least 15 years or develop a health problem that would prevent you from having lung cancer treatment.  Clinical breast exam.** / Every year after age 20 years.  BRCA-related cancer risk assessment.** / For women who have family members with a BRCA-related cancer (breast, ovarian, tubal, or peritoneal cancers).  Mammogram.** / Every year beginning at age 55 years and continuing for as long as you are in good health. Consult with your health care provider.  Pap test.** / Every 3 years starting at age 9 years through age 21 or 28 years with a history of 3 consecutive normal Pap tests.  HPV screening.** / Every 3 years from ages 70 years through ages 59 to 25 years with a history of 3 consecutive normal Pap tests.  Fecal occult blood test (FOBT) of stool. / Every year beginning at age 79 years and continuing until age 35 years. You may not need to do this test if you get a colonoscopy every 10 years.  Flexible sigmoidoscopy or  colonoscopy.** / Every 5 years for a flexible sigmoidoscopy or every 10 years for a colonoscopy beginning at age 37 years and continuing until age 23 years.  Hepatitis C blood test.** / For all people born from [birth year] through 1965 and any individual with known risks for hepatitis C.  Skin self-exam. / Monthly.  Influenza vaccine. / Every year.  Tetanus, diphtheria, and acellular pertussis (Tdap/Td) vaccine.** / Consult your health care provider. Pregnant women should receive 1 dose of Tdap vaccine during each pregnancy. 1 dose of Td every 10 years.  Varicella vaccine.** / Consult your health care provider. Pregnant females who do not have evidence of immunity should receive the first dose after pregnancy.  Zoster vaccine.** / 1 dose for adults aged 75 years or older.  Pneumococcal 13-valent conjugate (PCV13) vaccine.** / Consult your health care provider.  Pneumococcal polysaccharide (PPSV23) vaccine.** / 1 to 2 doses if you smoke cigarettes or if you have certain conditions.  Meningococcal vaccine.** / Consult your health care provider.  Hepatitis A vaccine.** / Consult your health care provider.  Hepatitis B vaccine.** / Consult your health care provider. Screening for abdominal aortic aneurysm (AAA)  by ultrasound is recommended for people over 50 who have history of high blood pressure or who are current or former smokers.

## 2015-09-22 NOTE — Progress Notes (Signed)
Complete Physical  Assessment and Plan: 1. Hyperlipidemia -continue medications, check lipids, decrease fatty foods, increase activity.  - CBC with Differential/Platelet - BASIC METABOLIC PANEL WITH GFR - Hepatic function panel - TSH - Lipid panel - EKG 12-Lead  2. Prediabetes Discussed general issues about diabetes pathophysiology and management., Educational material distributed., Suggested low cholesterol diet., Encouraged aerobic exercise., Discussed foot care., Reminded to get yearly retinal exam. - Hemoglobin A1c  3. Vitamin D deficiency - VITAMIN D 25 Hydroxy (Vit-D Deficiency, Fractures)  4. Allergic rhinitis, seasonal Continue flonase  5. Deflected nasal septum Follows ENT  6. Medication management - Magnesium  7. Encounter for general adult medical examination with abnormal findings - Follow up GYN - CBC with Differential/Platelet - BASIC METABOLIC PANEL WITH GFR - Hepatic function panel - TSH - Lipid panel - Hemoglobin A1c - Magnesium - VITAMIN D 25 Hydroxy (Vit-D Deficiency, Fractures) - Urinalysis, Routine w reflex microscopic (not at [facility]) - Microalbumin / creatinine urine ratio - Iron and TIBC - Ferritin - Vitamin B12 - EKG 12-Lead  8. Screening for blood or protein in urine - Urinalysis, Routine w reflex microscopic (not at [facility]) - Microalbumin / creatinine urine ratio  9. Anemia, unspecified anemia type - Iron and TIBC - Ferritin - Vitamin B12  10. Vaginal discharge Hylafem sent into [place], if too expensive will send in Boric acid  2 x week #30 5 RF   Discussed med's effects and SE's. Screening labs and tests as requested with regular follow-up as recommended.  HPI 55 y.o. female  presents for a complete physical. Her blood pressure has been controlled at home, today their BP is BP: 108/60 mmHg She denies chest pain, shortness of breath. She does workout. Rarely she states he will have dizziness with standing quickly after  squatting.  Her cholesterol is diet controlled.  Her cholesterol is at goal. The cholesterol last visit was:  Lab Results  Component Value Date   CHOL 168 03/23/2015   HDL 70 03/23/2015   LDLCALC 87 03/23/2015   TRIG 57 03/23/2015   CHOLHDL 2.4 03/23/2015   She has been working on diet and exercise for prediabetes, and denies blurry vision, polydipsia, polyphagia and polyuria. Last A1C in the office was:  Lab Results  Component Value Date   HGBA1C 5.6 03/23/2015  Patient is on Vitamin D supplement, 2000 IU daily  Continues to have a problem with yeast infections, she uses boric acid occ for yeast which helps. She is on estrogen patch. [facility], Dr. Theressa Millard is retiring.    Current Medications:  Current Outpatient Prescriptions on File Prior to Visit  Medication Sig Dispense Refill  . B Complex-C (SUPER B COMPLEX PO) Take 1 tablet by mouth daily.    . Cholecalciferol (VITAMIN D3) 2000 UNITS TABS Take 1 tablet by mouth daily.    . Multiple Vitamins-Minerals (CENTRUM SILVER PO) Take 1 tablet by mouth daily.    . Probiotic Product (PROBIOTIC DAILY PO) Take by mouth daily. Digestive Advantage    . Turmeric 500 MG CAPS Take 1 tablet by mouth daily.     No current facility-administered medications on file prior to visit.   Health Maintenance:  Immunization History  Administered Date(s) Administered  . Influenza Split 06/09/2014  . Influenza,inj,Quad PF,36+ Mos 05/17/2015  . Influenza-Unspecified 05/26/2013  . Pneumococcal Conjugate-13 06/09/2014  . Pneumococcal-Unspecified 08/06/2010  . Tdap 12/21/2011   Tetanus: 2013 Pneumovax: 2012 Prevnar 13: 2015- had a  reaction Flu vaccine: 05/2015 Zostavax:N/A Pap: April 2015 Dr. Vickey Sages, follows up May 25th.  MGM: 12/2013 DEXA: 12/2013- normal CXR 08/2014 Colonoscopy: 07/2011 due 2017 Dr. Tanna Savoy [facility] EGD: 2000 with Dr. Marina Goodell, + [facility] (Dr. Luetta Nutting) DEE 3 years in  [place]  Allergies:  Allergies  Allergen Reactions  . Montelukast     Other reaction(s): Other (See Comments)  . Singulair [Montelukast Sodium]   . Erythromycin Rash   Medical History:  Past Medical History  Diagnosis Date  . Asthma     as a child  . Hypotension   . Hyperlipidemia   . Diabetes (HCC)     pre-diabetic  . Vitamin D deficiency   . Hemorrhoids   . Diverticulosis   . Allergy   . GERD (gastroesophageal reflux disease)   . Arthritis    Surgical History:  Past Surgical History  Procedure Laterality Date  . Nasal septum surgery    . Carpal tunnel release Bilateral   . Colonoscopy  07/2011    Due 2017, Hem and tics, Dr. Tanna Savoy   Family History:  Family History  Problem Relation Age of Onset  . Other Father     colon resection  . Cancer Father 17    colon  . Colon cancer Maternal Grandmother    Social History:  Social History   Social History  . Marital Status: Single    Spouse Name: N/A  . Number of Children: 0  . Years of Education: N/A   Occupational History  . electrician    Social History Main Topics  . Smoking status: Former Smoker -- 1.00 packs/day for 8 years    Types: Cigarettes    Quit date: 08/01/1975  . Smokeless tobacco: Never Used  . Alcohol Use: Yes     Comment: Occasional on weekend  . Drug Use: Yes    Special: Marijuana  . Sexual Activity: Yes   Other Topics Concern  . Not on file   Social History Narrative   ROS Review of Systems  HENT: Negative.   Eyes: Negative.   Respiratory: Negative.  Negative for cough, shortness of breath and wheezing.   Cardiovascular: Negative for chest pain, palpitations, orthopnea, claudication, leg swelling and PND.  Gastrointestinal: Negative.   Genitourinary: Negative.        + recurrent yeast  Musculoskeletal: Negative.   Skin: Positive for rash (yeast on buttocks occ). Negative for itching.  Neurological: Positive for dizziness (occ with standing). Negative for tingling,  tremors, sensory change, speech change, focal weakness, seizures and loss of consciousness.  Psychiatric/Behavioral: Negative.     Physical Exam: Estimated body mass index is 21.11 kg/(m^2) as calculated from the following:   Height as of this encounter:  (1.753 m).   Weight as of this encounter: 143 lb (64.864 kg). Filed Vitals:   09/22/15 0902  BP: 108/60  Pulse: 78  Temp: 97.5 F (36.4 C)  Resp: 16   General Appearance: Well nourished, in no apparent distress. Eyes: PERRLA, EOMs, conjunctiva no swelling or erythema, normal fundi and vessels. Sinuses: No Frontal/maxillary tenderness ENT/Mouth: Ext aud canals clear, normal light reflex with TMs without erythema, bulging.  Good dentition. No erythema, swelling, or exudate on post pharynx. Tonsils not swollen or erythematous. Hearing normal.  Neck: Supple, thyroid normal. No bruits Respiratory: Respiratory effort normal, BS equal bilaterally without rales, rhonchi, wheezing or stridor. Cardio: RRR without murmurs, rubs or gallops. Brisk peripheral pulses without edema.  Chest: symmetric, with normal excursions and percussion.  Breasts: defer Abdomen: Soft, +BS. Non tender, no guarding, rebound, hernias, masses, or organomegaly. .  Lymphatics: Non tender without lymphadenopathy.  Genitourinary: defer Musculoskeletal: Full ROM all peripheral extremities,5/5 strength, and normal gait. Skin: Warm, dry without rashes, lesions, ecchymosis. + warts bilateral hands.  Neuro: Cranial nerves intact, reflexes equal bilaterally. Normal muscle tone, no cerebellar symptoms. Sensation intact.  Psych: Awake and oriented X 3, normal affect, Insight and Judgment appropriate.   EKG: IRBBB, nonspecific ST changes, similar to previous year.    Quentin Mulling 9:06 AM

## 2015-09-23 LAB — HEPATIC FUNCTION PANEL
ALK PHOS: 41 U/L (ref 33–130)
ALT: 14 U/L (ref 6–29)
AST: 21 U/L (ref 10–35)
Albumin: 4.2 g/dL (ref 3.6–5.1)
BILIRUBIN INDIRECT: 0.3 mg/dL (ref 0.2–1.2)
Bilirubin, Direct: 0.1 mg/dL (ref <=0.2)
TOTAL PROTEIN: 6.6 g/dL (ref 6.1–8.1)
Total Bilirubin: 0.4 mg/dL (ref 0.2–1.2)

## 2015-09-23 LAB — BASIC METABOLIC PANEL WITH GFR
BUN: 13 mg/dL (ref 7–25)
CALCIUM: 9.1 mg/dL (ref 8.6–10.4)
CO2: 27 mmol/L (ref 20–31)
Chloride: 101 mmol/L (ref 98–110)
Creat: 0.75 mg/dL (ref 0.50–1.05)
GFR, Est African American: >89 mL/min (ref >=60)
GLUCOSE: 70 mg/dL (ref 65–99)
POTASSIUM: 4.5 mmol/L (ref 3.5–5.3)
Sodium: 140 mmol/L (ref 135–146)

## 2015-09-23 LAB — URINALYSIS, ROUTINE W REFLEX MICROSCOPIC
Bilirubin Urine: NEGATIVE
Glucose, UA: NEGATIVE
Hgb urine dipstick: NEGATIVE
Ketones, ur: NEGATIVE
Leukocytes, UA: NEGATIVE
NITRITE: NEGATIVE
PH: 7.5 (ref 5.0–8.0)
Protein, ur: NEGATIVE
SPECIFIC GRAVITY, URINE: 1.011 (ref 1.001–1.035)

## 2015-09-23 LAB — LIPID PANEL
Cholesterol: 185 mg/dL (ref 125–200)
HDL: 73 mg/dL (ref >=46)
LDL CALC: 86 mg/dL (ref <130)
Total CHOL/HDL Ratio: 2.5 Ratio (ref <=5.0)
Triglycerides: 128 mg/dL (ref <150)
VLDL: 26 mg/dL (ref <30)

## 2015-09-23 LAB — IRON AND TIBC
%SAT: 23 % (ref 11–50)
IRON: 81 ug/dL (ref 45–160)
TIBC: 348 ug/dL (ref 250–450)
UIBC: 267 ug/dL (ref 125–400)

## 2015-09-23 LAB — MAGNESIUM: Magnesium: 2.1 mg/dL (ref 1.5–2.5)

## 2015-09-23 LAB — VITAMIN B12: Vitamin B-12: 910 pg/mL (ref 200–1100)

## 2015-09-23 LAB — MICROALBUMIN / CREATININE URINE RATIO
Creatinine, Urine: 35 mg/dL (ref 20–320)
MICROALB UR: 0.2 mg/dL
MICROALB/CREAT RATIO: 6 ug/mg{creat} (ref <30)

## 2015-09-23 LAB — FERRITIN: Ferritin: 59 ng/mL (ref 10–232)

## 2015-09-23 LAB — HEMOGLOBIN A1C
HEMOGLOBIN A1C: 5.7 % — AB (ref <5.7)
MEAN PLASMA GLUCOSE: 117 mg/dL — AB (ref <117)

## 2015-09-23 LAB — VITAMIN D 25 HYDROXY (VIT D DEFICIENCY, FRACTURES): Vit D, 25-Hydroxy: 29 ng/mL — ABNORMAL LOW (ref 30–100)

## 2015-09-23 LAB — TSH: TSH: 0.84 mIU/L

## 2015-12-23 DIAGNOSIS — Z01419 Encounter for gynecological examination (general) (routine) without abnormal findings: Secondary | ICD-10-CM | POA: Diagnosis not present

## 2015-12-23 DIAGNOSIS — Z1231 Encounter for screening mammogram for malignant neoplasm of breast: Secondary | ICD-10-CM | POA: Diagnosis not present

## 2015-12-23 DIAGNOSIS — Z6821 Body mass index (BMI) 21.0-21.9, adult: Secondary | ICD-10-CM | POA: Diagnosis not present

## 2015-12-23 DIAGNOSIS — Z13820 Encounter for screening for osteoporosis: Secondary | ICD-10-CM | POA: Diagnosis not present

## 2016-07-27 ENCOUNTER — Ambulatory Visit: Payer: BLUE CROSS/BLUE SHIELD

## 2016-07-27 DIAGNOSIS — H5203 Hypermetropia, bilateral: Secondary | ICD-10-CM | POA: Diagnosis not present

## 2016-07-27 DIAGNOSIS — H524 Presbyopia: Secondary | ICD-10-CM | POA: Diagnosis not present

## 2016-07-27 DIAGNOSIS — H52223 Regular astigmatism, bilateral: Secondary | ICD-10-CM | POA: Diagnosis not present

## 2016-09-25 ENCOUNTER — Encounter: Payer: Self-pay | Admitting: Physician Assistant

## 2016-09-25 ENCOUNTER — Ambulatory Visit (INDEPENDENT_AMBULATORY_CARE_PROVIDER_SITE_OTHER): Payer: BLUE CROSS/BLUE SHIELD | Admitting: Physician Assistant

## 2016-09-25 VITALS — BP 98/66 | HR 70 | Temp 98.6°F | Resp 14 | Ht 68.0 in | Wt 144.0 lb

## 2016-09-25 DIAGNOSIS — R1024 Suprapubic pain: Secondary | ICD-10-CM

## 2016-09-25 DIAGNOSIS — J342 Deviated nasal septum: Secondary | ICD-10-CM

## 2016-09-25 DIAGNOSIS — E559 Vitamin D deficiency, unspecified: Secondary | ICD-10-CM

## 2016-09-25 DIAGNOSIS — J302 Other seasonal allergic rhinitis: Secondary | ICD-10-CM

## 2016-09-25 DIAGNOSIS — Z0000 Encounter for general adult medical examination without abnormal findings: Secondary | ICD-10-CM

## 2016-09-25 DIAGNOSIS — Z79899 Other long term (current) drug therapy: Secondary | ICD-10-CM

## 2016-09-25 DIAGNOSIS — Z1389 Encounter for screening for other disorder: Secondary | ICD-10-CM

## 2016-09-25 DIAGNOSIS — E785 Hyperlipidemia, unspecified: Secondary | ICD-10-CM

## 2016-09-25 DIAGNOSIS — R102 Pelvic and perineal pain: Secondary | ICD-10-CM

## 2016-09-25 DIAGNOSIS — R7303 Prediabetes: Secondary | ICD-10-CM

## 2016-09-25 DIAGNOSIS — R194 Change in bowel habit: Secondary | ICD-10-CM

## 2016-09-25 LAB — HEPATIC FUNCTION PANEL
ALT: 21 U/L (ref 6–29)
AST: 25 U/L (ref 10–35)
Albumin: 4.5 g/dL (ref 3.6–5.1)
Alkaline Phosphatase: 42 U/L (ref 33–130)
BILIRUBIN DIRECT: 0.1 mg/dL (ref <=0.2)
BILIRUBIN INDIRECT: 0.3 mg/dL (ref 0.2–1.2)
Total Bilirubin: 0.4 mg/dL (ref 0.2–1.2)
Total Protein: 7 g/dL (ref 6.1–8.1)

## 2016-09-25 LAB — CBC WITH DIFFERENTIAL/PLATELET
Basophils Absolute: 47 {cells}/uL (ref 0–200)
Basophils Relative: 1 %
Eosinophils Absolute: 94 {cells}/uL (ref 15–500)
Eosinophils Relative: 2 %
HCT: 40.6 % (ref 35.0–45.0)
Hemoglobin: 13.2 g/dL (ref 11.7–15.5)
Lymphocytes Relative: 34 %
Lymphs Abs: 1598 {cells}/uL (ref 850–3900)
MCH: 30.7 pg (ref 27.0–33.0)
MCHC: 32.5 g/dL (ref 32.0–36.0)
MCV: 94.4 fL (ref 80.0–100.0)
MPV: 9 fL (ref 7.5–12.5)
Monocytes Absolute: 564 {cells}/uL (ref 200–950)
Monocytes Relative: 12 %
Neutro Abs: 2397 {cells}/uL (ref 1500–7800)
Neutrophils Relative %: 51 %
Platelets: 268 K/uL (ref 140–400)
RBC: 4.3 MIL/uL (ref 3.80–5.10)
RDW: 13.5 % (ref 11.0–15.0)
WBC: 4.7 K/uL (ref 3.8–10.8)

## 2016-09-25 LAB — BASIC METABOLIC PANEL WITHOUT GFR
BUN: 10 mg/dL (ref 7–25)
CO2: 30 mmol/L (ref 20–31)
Calcium: 9.8 mg/dL (ref 8.6–10.4)
Chloride: 104 mmol/L (ref 98–110)
Creat: 0.75 mg/dL (ref 0.50–1.05)
GFR, Est African American: >89 mL/min
GFR, Est Non African American: 89 mL/min
Glucose, Bld: 50 mg/dL — ABNORMAL LOW (ref 65–99)
Potassium: 4.1 mmol/L (ref 3.5–5.3)
Sodium: 140 mmol/L (ref 135–146)

## 2016-09-25 LAB — LIPID PANEL
CHOL/HDL RATIO: 2.7 ratio (ref <5.0)
CHOLESTEROL: 194 mg/dL (ref <200)
HDL: 72 mg/dL (ref >50)
LDL Cholesterol: 99 mg/dL (ref <100)
TRIGLYCERIDES: 113 mg/dL (ref <150)
VLDL: 23 mg/dL (ref <30)

## 2016-09-25 LAB — HEMOGLOBIN A1C
HEMOGLOBIN A1C: 5.3 % (ref <5.7)
MEAN PLASMA GLUCOSE: 105 mg/dL

## 2016-09-25 LAB — TSH: TSH: 1.42 m[IU]/L

## 2016-09-25 MED ORDER — ESTRADIOL-NORETHINDRONE ACET 0.5-0.1 MG PO TABS: 1.0000 | ORAL_TABLET | Freq: Every day | ORAL | 3 refills | Status: DC

## 2016-09-25 NOTE — Patient Instructions (Addendum)
NEED COLONOSCOPY Dr. Tanna Savoy [facility]  Add ENTERIC COATED low dose 81 mg Aspirin daily OR can do every other day if you have easy bruising to protect your heart and head. As well as to reduce risk of Colon Cancer by 20 %, Skin Cancer by 26 % , Melanoma by 46% and Pancreatic cancer by 60%    Tennis Elbow Tennis elbow (lateral epicondylitis) is inflammation of the outer tendons of your forearm close to your elbow. Your tendons attach your muscles to your bones. The outer tendons of your forearm are used to extend your wrist, and they attach on the outside part of your elbow. Tennis elbow is often found in people who play tennis, but anyone may get the condition from repeatedly extending the wrist or turning the forearm. What are the causes? This condition is caused by repeatedly extending your wrist and using your hands. It can result from sports or work that requires repetitive forearm movements. Tennis elbow may also be caused by an injury. What increases the risk? You have a higher risk of developing tennis elbow if you play tennis or another racquet sport. You also have a higher risk if you frequently use your hands for work. This condition is also more likely to develop in:  Musicians.  Carpenters, painters, and plumbers.  Cooks.  Cashiers.  People who work in [employer].  Holiday representative workers.  Butchers.  People who use computers. What are the signs or symptoms? Symptoms of this condition include:  Pain and tenderness in your forearm and the outer part of your elbow. You may only feel the pain when you use your arm, or you may feel it even when you are not using your arm.  A burning feeling that runs from your elbow through your arm.  Weak grip in your hands. How is this diagnosed? This condition may be diagnosed by medical history and physical exam. You may also have other tests, including:  X-rays.  MRI. How is this treated? Your health care provider  will recommend lifestyle adjustments, such as resting and icing your arm. Treatment may also include:  Medicines for inflammation. This may include shots of cortisone if your pain continues.  Physical therapy. This may include massage or exercises.  An elbow brace. Surgery may eventually be recommended if your pain does not go away with treatment. Follow these instructions at home: Activity  Rest your elbow and wrist as directed by your health care provider. Try to avoid any activities that caused the problem until your health care provider says that you can do them again.  If a physical therapist teaches you exercises, do all of them as directed.  If you lift an object, lift it with your palm facing upward. This lowers the stress on your elbow. Lifestyle  If your tennis elbow is caused by sports, check your equipment and make sure that:  You are using it correctly.  It is the best fit for you.  If your tennis elbow is caused by work, take breaks frequently, if you are able. Talk with your manager about how to best perform tasks in a way that is safe.  If your tennis elbow is caused by computer use, talk with your manager about any changes that can be made to your work environment. General instructions  If directed, apply ice to the painful area:  Put ice in a plastic bag.  Place a towel between your skin and the bag.  Leave the ice on for  20 minutes, 2-3 times per day.  Take medicines only as directed by your health care provider.  If you were given a brace, wear it as directed by your health care provider.  Keep all follow-up visits as directed by your health care provider. This is important. Contact a health care provider if:  Your pain does not get better with treatment.  Your pain gets worse.  You have numbness or weakness in your forearm, hand, or fingers. This information is not intended to replace advice given to you by your health care provider. Make sure you  discuss any questions you have with your health care provider. Document Released: 07/17/2005 Document Revised: 03/16/2016 Document Reviewed: 07/13/2014 Elsevier Interactive Patient Education  2017 Elsevier Inc.   Hypotension As your heart beats, it forces blood through your body. This force is called blood pressure. If you have hypotension, you have low blood pressure. When your blood pressure is too low, you may not get enough blood to your brain. You may feel weak, feel light-headed, have a fast heartbeat, or even pass out (faint). Follow these instructions at home: Eating and drinking  Drink enough fluids to keep your pee (urine) clear or pale yellow.  Eat a healthy diet, and follow instructions from your doctor about eating or drinking restrictions. A healthy diet includes:  Fresh fruits and vegetables.  Whole grains.  Low-fat (lean) meats.  Low-fat dairy products.  Eat extra salt only as told. Do not add extra salt to your diet unless your doctor tells you to.  Eat small meals often.  Avoid standing up quickly after you eat. Medicines  Take over-the-counter and prescription medicines only as told by your doctor.  Follow instructions from your doctor about changing how much you take (the dosage) of your medicines, if this applies.  Do not stop or change your medicine on your own. General instructions  Wear compression stockings as told by your doctor.  Get up slowly from lying down or sitting.  Avoid hot showers and a lot of heat as told by your doctor.  Return to your normal activities as told by your doctor. Ask what activities are safe for you.  Do not use any products that contain nicotine or tobacco, such as cigarettes and e-cigarettes. If you need help quitting, ask your doctor.  Keep all follow-up visits as told by your doctor. This is important. Contact a doctor if:  You throw up (vomit).  You have watery poop (diarrhea).  You have a fever for more  than 2-3 days.  You feel more thirsty than normal.  You feel weak and tired. Get help right away if:  You have chest pain.  You have a fast or irregular heartbeat.  You lose feeling (get numbness) in any part of your body.  You cannot move your arms or your legs.  You have trouble talking.  You get sweaty or feel light-headed.  You faint.  You have trouble breathing.  You have trouble staying awake.  You feel confused. This information is not intended to replace advice given to you by your health care provider. Make sure you discuss any questions you have with your health care provider. Document Released: 10/11/2009 Document Revised: 04/04/2016 Document Reviewed: 04/04/2016 Elsevier Interactive Patient Education  2017 [employer]

## 2016-09-25 NOTE — Progress Notes (Signed)
Complete Physical  Assessment and Plan: Hyperlipidemia -continue medications, check lipids, decrease fatty foods, increase activity.  - CBC with Differential/Platelet - BASIC METABOLIC PANEL WITH GFR - Hepatic function panel - TSH - Lipid panel - EKG 12-Lead  Prediabetes Discussed general issues about diabetes pathophysiology and management., Educational material distributed., Suggested low cholesterol diet., Encouraged aerobic exercise., Discussed foot care., Reminded to get yearly retinal exam. - Hemoglobin A1c  Vitamin D deficiency - VITAMIN D 25 Hydroxy (Vit-D Deficiency, Fractures)  Allergic rhinitis, seasonal Continue flonase  Deflected nasal septum Follows ENT   Medication management - Magnesium  Encounter for general adult medical examination with abnormal findings   Screening for blood or protein in urine - Urinalysis, Routine w reflex microscopic (not [facility]) - Microalbumin / creatinine urine ratio  Lateral elbow RICE, aleve, if not better will follow up   Discussed med's effects and SE's. Screening labs and tests as requested with regular follow-up as recommended.  HPI 56 y.o. female  presents for a complete physical. Her blood pressure has been controlled at home, today their BP is BP: 98/66 She denies chest pain, shortness of breath. She does workout and is very physical at her job.  Her cholesterol is diet controlled.  Her cholesterol is at goal. The cholesterol last visit was:  Lab Results  Component Value Date   CHOL 185 09/22/2015   HDL 73 09/22/2015   LDLCALC 86 09/22/2015   TRIG 128 09/22/2015   CHOLHDL 2.5 09/22/2015   She has been working on diet and exercise for prediabetes, and denies blurry vision, polydipsia, polyphagia and polyuria. Last A1C in the office was:  Lab Results  Component Value Date   HGBA1C 5.7 (H) 09/22/2015  Patient is on Vitamin D supplement, 2000 IU daily  Continues to have a problem with yeast infections, she uses  boric acid occ for yeast which helps. She is on estrogen patch. Will [facility], Dr. Theressa MillardMeser is retiring.  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 B Complex-C (SUPER B COMPLEX PO) Take 1 tablet by mouth daily.    . Cholecalciferol (VITAMIN D3) 2000 UNITS TABS Take 1 tablet by mouth daily.    Marland Kitchen. estradiol-levonorgestrel (CLIMARA PRO) 0.045-0.015 MG/DAY     . Homeopathic Products (HYLAFEM) SUPP Place 1 applicator vaginally 2 (two) times a week. 21 suppository 3  . Multiple Vitamins-Minerals (CENTRUM SILVER PO) Take 1 tablet by mouth daily.    . Probiotic Product (PROBIOTIC DAILY PO) Take by mouth daily. Digestive Advantage    . ranitidine (ZANTAC) 150 MG tablet Take 150 mg by mouth.    . Turmeric 500 MG CAPS Take 1 tablet by mouth daily.     No current facility-administered medications on file prior to visit.    Health Maintenance:  Immunization History  Administered Date(s) Administered  . Influenza Split 06/09/2014  . Influenza,inj,Quad PF,36+ Mos 05/17/2015  . Influenza-Unspecified 05/26/2013  . Pneumococcal Conjugate-13 06/09/2014  . Pneumococcal-Unspecified 08/06/2010  . Tdap 12/21/2011   Tetanus: 2013 Pneumovax: 2012 Prevnar 13: 2015- had a reaction Flu vaccine: 06/2016 at [facility] Zostavax:N/A  Pap: April 2017 Dr. Vickey SagesAtkins, yearly MGM: 12/2015 at GYN office DEXA: 12/2013-  normal CXR 08/2014 Colonoscopy: 07/2011 due 2017 Dr. Tanna Savoy [facility] EGD: 2000 with Dr. Marina Goodell, + [facility] (Dr. Luetta Nutting) DEE 3 years in [place]  Medical History:  Past Medical History:  Diagnosis Date  . Allergy   . Arthritis   . Asthma    as a child  . Diabetes (HCC)    pre-diabetic  .  Diverticulosis   . GERD (gastroesophageal reflux disease)   . Hemorrhoids   . Hyperlipidemia   . Hypotension   . Vitamin D deficiency    Allergies Allergies  Allergen Reactions  . Montelukast     Other reaction(s): Other (See Comments)  . Singulair [Montelukast Sodium]   . Erythromycin Rash    SURGICAL HISTORY She  has a past surgical history that includes Nasal septum surgery; Carpal tunnel release (Bilateral); and Colonoscopy (07/2011). FAMILY HISTORY Her family history includes Cancer (age of onset: 57) in her father; Colon cancer in her maternal grandmother; Other in her father. SOCIAL HISTORY She  reports that she quit smoking about 41 years ago. Her smoking use included Cigarettes. She has a 8.00 pack-year smoking history. She has never used smokeless tobacco. She reports that she drinks alcohol. She reports that she uses drugs, including Marijuana.  ROS Review of Systems  HENT: Negative.   Eyes: Negative.   Respiratory: Negative.  Negative for cough, shortness of breath and wheezing.   Cardiovascular: Negative for chest pain, palpitations, orthopnea, claudication, leg swelling and PND.  Gastrointestinal: Negative.   Genitourinary: Negative.        + recurrent yeast  Musculoskeletal: Positive for joint pain (right elbow) and neck pain. Negative for back pain and myalgias.  Skin: Negative for itching and rash.  Neurological: Negative for dizziness, tingling, tremors, sensory change, speech change, focal weakness, seizures and loss of consciousness.  Psychiatric/Behavioral: Negative.     Physical Exam: Estimated body mass index is 21.9 kg/m as calculated from the following:   Height as of this encounter: 5\' 8"  (1.727 m).   Weight as of this encounter: 144 lb (65.3 kg). Vitals:   09/25/16 0912  BP: 98/66  Pulse: 70  Resp: 14  Temp: 98.6 F (37 C)   General Appearance: Well nourished, in no apparent distress. Eyes: PERRLA, EOMs, conjunctiva no swelling or  erythema, normal fundi and vessels. Sinuses: No Frontal/maxillary tenderness ENT/Mouth: Ext aud canals clear, normal light reflex with TMs without erythema, bulging.  Good dentition. No erythema, swelling, or exudate on post pharynx. Tonsils not swollen or erythematous. Hearing normal.  Neck: Supple, thyroid normal. No bruits Respiratory: Respiratory effort normal, BS equal bilaterally without rales, rhonchi, wheezing or stridor. Cardio: RRR without murmurs, rubs or gallops. Brisk peripheral pulses without edema.  Chest: symmetric, with normal excursions and percussion. Breasts: defer Abdomen: Soft, +BS. Non tender, no guarding, rebound, hernias, masses, or organomegaly. .  Lymphatics: Non tender without lymphadenopathy.  Genitourinary: defer Musculoskeletal: Full ROM all peripheral extremities,5/5 strength, and normal gait. Skin: Warm, dry without rashes, lesions, ecchymosis. + warts bilateral hands.  Neuro: Cranial nerves intact, reflexes equal bilaterally. Normal muscle tone, no cerebellar symptoms. Sensation intact.  Psych: Awake and oriented X 3, normal affect, Insight and Judgment appropriate.   EKG: IRBBB, nonspecific ST changes, similar to previous year.    Carla Morton 9:16 AM

## 2016-09-26 LAB — URINALYSIS, ROUTINE W REFLEX MICROSCOPIC
Bilirubin Urine: NEGATIVE
GLUCOSE, UA: NEGATIVE
Hgb urine dipstick: NEGATIVE
Ketones, ur: NEGATIVE
LEUKOCYTES UA: NEGATIVE
Nitrite: NEGATIVE
PROTEIN: NEGATIVE
SPECIFIC GRAVITY, URINE: 1.009 (ref 1.001–1.035)
pH: 6 (ref 5.0–8.0)

## 2016-09-26 LAB — MICROALBUMIN / CREATININE URINE RATIO
Creatinine, Urine: 35 mg/dL (ref 20–320)
Microalb, Ur: <0.2 mg/dL

## 2016-09-26 LAB — VITAMIN D 25 HYDROXY (VIT D DEFICIENCY, FRACTURES): VIT D 25 HYDROXY: 67 ng/mL (ref 30–100)

## 2016-09-26 LAB — MAGNESIUM: Magnesium: 2.1 mg/dL (ref 1.5–2.5)

## 2016-12-29 DIAGNOSIS — Z6821 Body mass index (BMI) 21.0-21.9, adult: Secondary | ICD-10-CM | POA: Diagnosis not present

## 2016-12-29 DIAGNOSIS — Z01419 Encounter for gynecological examination (general) (routine) without abnormal findings: Secondary | ICD-10-CM | POA: Diagnosis not present

## 2016-12-29 DIAGNOSIS — Z1231 Encounter for screening mammogram for malignant neoplasm of breast: Secondary | ICD-10-CM | POA: Diagnosis not present

## 2017-09-23 NOTE — Progress Notes (Signed)
Complete Physical  Assessment and Plan: Hyperlipidemia -continue medications, check lipids, decrease fatty foods, increase activity.  - CBC with Differential/Platelet - BASIC METABOLIC PANEL WITH GFR - Hepatic function panel - TSH - Lipid panel  Allergic rhinitis, seasonal Continue flonase  Deflected nasal septum Follows ENT  Encounter for general adult medical examination with abnormal findings GET COLONOSCOPY   WILL DO MINIMAL LABS DUE TO INSURANCE  Discussed med's effects and SE's. Screening labs and tests as requested with regular follow-up as recommended. Future Appointments  Date Time Provider Department Center  09/26/2018  9:00 AM Quentin Mullingollier, Shakeira Rhee, PA-C [facility] None    HPI 57 y.o. female  presents for a complete physical. Her blood pressure has been controlled at home, today their BP is BP: (!) 102/58 She denies chest pain, shortness of breath. She does workout and is very physical at her job.  Her cholesterol is diet controlled.  Her cholesterol is at goal. The cholesterol last visit was:  Lab Results  Component Value Date   CHOL 194 09/25/2016   HDL 72 09/25/2016   LDLCALC 99 09/25/2016   TRIG 113 09/25/2016   CHOLHDL 2.7 09/25/2016   She has been working on diet and exercise for prediabetes, and denies blurry vision, polydipsia, polyphagia and polyuria. Last A1C in the office was:  Lab Results  Component Value Date   HGBA1C 5.3 09/25/2016  Patient is on Vitamin D supplement, 2000 IU daily  Continues to have a problem with yeast infections, she uses boric acid occ for yeast which helps. She is on activella, trying to do every other day but having hot flashes again.  Will [facility] up [facility], Dr. Theressa MillardMeser is retiring.  BMI is Body mass index is 21.17 kg/m., she is working on diet and exercise. Wt Readings from Last 3 Encounters:  09/25/17 139 lb 3.2 oz (63.1 kg)  09/25/16 144 lb (65.3 kg)  09/22/15 143 lb (64.9 kg)    Current  Medications:  Current Outpatient Medications on File Prior to Visit  Medication Sig Dispense Refill  . B Complex-C (SUPER B COMPLEX PO) Take 1 tablet by mouth daily.    . cetirizine (ZYRTEC) 10 MG tablet Take 10 mg by mouth daily.    . Cholecalciferol (VITAMIN D PO) Take 5,000 Units by mouth daily.    . Estradiol-Norethindrone Acet (ACTIVELLA) 0.5-0.1 MG tablet Take 1 tablet by mouth daily. 30 tablet 3  . fluticasone (FLONASE) 50 MCG/ACT nasal spray Place 2 sprays into both nostrils daily.    . Homeopathic Products (HYLAFEM) SUPP Place 1 applicator vaginally 2 (two) times a week. 21 suppository 3  . Multiple Vitamins-Minerals (CENTRUM SILVER PO) Take 1 tablet by mouth daily.    . Probiotic Product (PROBIOTIC DAILY PO) Take by mouth daily. Digestive Advantage    . ranitidine (ZANTAC) 150 MG tablet Take 150 mg by mouth.    . TURMERIC PO Take 1,000 mg by mouth daily.     No current facility-administered medications on file prior to visit.    Health Maintenance:  Immunization History  Administered Date(s) Administered  . Influenza Inj Mdck Quad Pf 06/09/2017  . Influenza Split 06/09/2014  . Influenza,inj,Quad PF,6+ Mos 05/17/2015  . Influenza-Unspecified 05/26/2013  . Pneumococcal Conjugate-13 06/09/2014  . Pneumococcal-Unspecified 08/06/2010  . Tdap 12/21/2011   Tetanus: 2013 Pneumovax: 2012 Prevnar 13: 2015- had a reaction Flu vaccine: 06/2016 at [facility] Zostavax:N/A  Pap: April 2017 Dr. Vickey SagesAtkins, yearly MGM: 12/2015 at GYN office DEXA: 12/2013-  normal CXR 08/2014 Colonoscopy: 07/2011 due 2017 Dr. Tanna Sav[facility] EGD: 2000 with Dr. Marina Goodell, + [facility] (Dr. Luetta Nutting) DEE 3 years in [place]  Medical History:  Past Medical History:  Diagnosis Date  . Allergy   . Arthritis   . Asthma    as a child  . Diabetes (HCC)    pre-diabetic  . Diverticulosis   . GERD (gastroesophageal reflux disease)   . Hemorrhoids   . Hyperlipidemia   . Hypotension   . Vitamin D  deficiency    Allergies Allergies  Allergen Reactions  . Montelukast     Other reaction(s): Other (See Comments)  . Singulair [Montelukast Sodium]   . Erythromycin Rash    SURGICAL HISTORY She  has a past surgical history that includes Nasal septum surgery; Carpal tunnel release (Bilateral); and Colonoscopy (07/2011). FAMILY HISTORY Her family history includes Cancer (age of onset: 54) in her father; Colon cancer in her maternal grandmother; Other in her father. SOCIAL HISTORY She  reports that she quit smoking about 42 years ago. Her smoking use included cigarettes. She has a 8.00 pack-year smoking history. she has never used smokeless tobacco. She reports that she drinks alcohol. She reports that she uses drugs. Drug: Marijuana.  ROS Review of Systems  HENT: Negative.   Eyes: Negative.   Respiratory: Negative.  Negative for cough, shortness of breath and wheezing.   Cardiovascular: Negative for chest pain, palpitations, orthopnea, claudication, leg swelling and PND.  Gastrointestinal: Negative.   Genitourinary: Negative.        + recurrent yeast  Musculoskeletal: Positive for joint pain (right elbow) and neck pain. Negative for back pain and myalgias.  Skin: Negative for itching and rash.  Neurological: Negative for dizziness, tingling, tremors, sensory change, speech change, focal weakness, seizures and loss of consciousness.  Psychiatric/Behavioral: Negative.     Physical Exam: Estimated body mass index is 21.17 kg/m as calculated from the following:   Height as of this encounter: 5\' 8"  (1.727 m).   Weight as of this encounter: 139 lb 3.2 oz (63.1 kg). Vitals:   09/25/17 0856  BP: (!) 102/58  Pulse: 68  Resp: 16  Temp: (!) 97.5 F (36.4 C)   General Appearance: Well nourished, in no apparent distress. Eyes: PERRLA, EOMs, conjunctiva no swelling or erythema, normal fundi and vessels. Sinuses: No Frontal/maxillary tenderness ENT/Mouth: Ext aud canals clear, normal  light reflex with TMs without erythema, bulging.  Good dentition. No erythema, swelling, or exudate on post pharynx. Tonsils not swollen or erythematous. Hearing normal.  Neck: Supple, thyroid normal. No bruits Respiratory: Respiratory effort normal, BS equal bilaterally without rales, rhonchi, wheezing or stridor. Cardio: RRR without murmurs, rubs or gallops. Brisk peripheral pulses without edema.  Chest: symmetric, with normal excursions and percussion. Breasts: defer Abdomen: Soft, +BS. Non tender, no guarding, rebound, hernias, masses, or organomegaly. .  Lymphatics: Non tender without lymphadenopathy.  Genitourinary: defer Musculoskeletal: Full ROM all peripheral extremities,5/5 strength, and normal gait. Skin: Warm, dry without rashes, lesions, ecchymosis. + warts bilateral hands.  Neuro: Cranial nerves intact, reflexes equal bilaterally. Normal muscle tone, no cerebellar symptoms. Sensation intact.  Psych: Awake and oriented X 3, normal affect, Insight and Judgment appropriate.   EKG: IRBBB, nonspecific ST changes, similar to previous year.    Quentin Mulling 9:13 AM

## 2017-09-25 ENCOUNTER — Ambulatory Visit: Payer: BLUE CROSS/BLUE SHIELD | Admitting: Physician Assistant

## 2017-09-25 VITALS — BP 102/58 | HR 68 | Temp 97.5°F | Resp 16 | Ht 68.0 in | Wt 139.2 lb

## 2017-09-25 DIAGNOSIS — J302 Other seasonal allergic rhinitis: Secondary | ICD-10-CM

## 2017-09-25 DIAGNOSIS — Z79899 Other long term (current) drug therapy: Secondary | ICD-10-CM

## 2017-09-25 DIAGNOSIS — Z1389 Encounter for screening for other disorder: Secondary | ICD-10-CM

## 2017-09-25 DIAGNOSIS — I10 Essential (primary) hypertension: Secondary | ICD-10-CM

## 2017-09-25 DIAGNOSIS — E559 Vitamin D deficiency, unspecified: Secondary | ICD-10-CM

## 2017-09-25 DIAGNOSIS — R7303 Prediabetes: Secondary | ICD-10-CM

## 2017-09-25 DIAGNOSIS — J342 Deviated nasal septum: Secondary | ICD-10-CM

## 2017-09-25 DIAGNOSIS — Z0000 Encounter for general adult medical examination without abnormal findings: Secondary | ICD-10-CM

## 2017-09-25 DIAGNOSIS — E785 Hyperlipidemia, unspecified: Secondary | ICD-10-CM

## 2017-09-25 MED ORDER — GABAPENTIN 300 MG PO CAPS: ORAL_CAPSULE | ORAL | 2 refills | Status: DC

## 2017-09-25 NOTE — Patient Instructions (Addendum)
Tumeric with black pepper extract is a great natural antiinflammatory that helps with arthritis and aches and pain. Can get from [employer] or any health food store. Need to take at least 800mg  twice a day with food.   Can take gabapentin at night for hot flashes.  It can make you sleepy so we suggest trying it at night first and please plan to not drive or do anything strenuous. Also please do not take this medication with alcohol.  Start out 1 pill at night before bed, can increase to 2 pills at night before bed. Please call the office if you have any side effects.

## 2017-09-26 LAB — CBC WITH DIFFERENTIAL/PLATELET
Basophils Absolute: 28 cells/uL (ref 0–200)
Basophils Relative: 0.6 %
EOS PCT: 1.7 %
Eosinophils Absolute: 80 cells/uL (ref 15–500)
HCT: 39.1 % (ref 35.0–45.0)
HEMOGLOBIN: 13.5 g/dL (ref 11.7–15.5)
LYMPHS ABS: 1833 {cells}/uL (ref 850–3900)
MCH: 31.9 pg (ref 27.0–33.0)
MCHC: 34.5 g/dL (ref 32.0–36.0)
MCV: 92.4 fL (ref 80.0–100.0)
MPV: 9.4 fL (ref 7.5–12.5)
Monocytes Relative: 12.8 %
NEUTROS ABS: 2157 {cells}/uL (ref 1500–7800)
Neutrophils Relative %: 45.9 %
Platelets: 251 10*3/uL (ref 140–400)
RBC: 4.23 10*6/uL (ref 3.80–5.10)
RDW: 11.8 % (ref 11.0–15.0)
Total Lymphocyte: 39 %
WBC: 4.7 10*3/uL (ref 3.8–10.8)
WBCMIX: 602 {cells}/uL (ref 200–950)

## 2017-09-26 LAB — BASIC METABOLIC PANEL WITH GFR
BUN: 14 mg/dL (ref 7–25)
CALCIUM: 10.5 mg/dL — AB (ref 8.6–10.4)
CO2: 28 mmol/L (ref 20–32)
CREATININE: 0.78 mg/dL (ref 0.50–1.05)
Chloride: 101 mmol/L (ref 98–110)
GFR, EST AFRICAN AMERICAN: 98 mL/min/{1.73_m2} (ref >=60)
GFR, EST NON AFRICAN AMERICAN: 84 mL/min/{1.73_m2} (ref >=60)
Glucose, Bld: 69 mg/dL (ref 65–99)
POTASSIUM: 5.2 mmol/L (ref 3.5–5.3)
Sodium: 138 mmol/L (ref 135–146)

## 2017-09-26 LAB — HEPATIC FUNCTION PANEL
AG Ratio: 1.8 (calc) (ref 1.0–2.5)
ALBUMIN MSPROF: 4.9 g/dL (ref 3.6–5.1)
ALT: 21 U/L (ref 6–29)
AST: 30 U/L (ref 10–35)
Alkaline phosphatase (APISO): 46 U/L (ref 33–130)
BILIRUBIN DIRECT: 0.1 mg/dL (ref 0.0–0.2)
BILIRUBIN TOTAL: 0.7 mg/dL (ref 0.2–1.2)
Globulin: 2.7 g/dL (calc) (ref 1.9–3.7)
Indirect Bilirubin: 0.6 mg/dL (calc) (ref 0.2–1.2)
Total Protein: 7.6 g/dL (ref 6.1–8.1)

## 2017-09-26 LAB — LIPID PANEL
CHOL/HDL RATIO: 2.7 (calc) (ref <5.0)
Cholesterol: 229 mg/dL — ABNORMAL HIGH (ref <200)
HDL: 86 mg/dL (ref >50)
LDL CHOLESTEROL (CALC): 125 mg/dL — AB
Non-HDL Cholesterol (Calc): 143 mg/dL (calc) — ABNORMAL HIGH (ref <130)
TRIGLYCERIDES: 85 mg/dL (ref <150)

## 2017-09-26 LAB — TSH: TSH: 1.35 mIU/L (ref 0.40–4.50)

## 2017-12-21 DIAGNOSIS — Z1211 Encounter for screening for malignant neoplasm of colon: Secondary | ICD-10-CM | POA: Diagnosis not present

## 2017-12-21 DIAGNOSIS — Z8371 Family history of colonic polyps: Secondary | ICD-10-CM | POA: Diagnosis not present

## 2017-12-21 DIAGNOSIS — K5730 Diverticulosis of large intestine without perforation or abscess without bleeding: Secondary | ICD-10-CM | POA: Diagnosis not present

## 2017-12-21 DIAGNOSIS — K6289 Other specified diseases of anus and rectum: Secondary | ICD-10-CM | POA: Diagnosis not present

## 2017-12-21 LAB — HM COLONOSCOPY

## 2017-12-27 ENCOUNTER — Encounter: Payer: Self-pay | Admitting: *Deleted

## 2018-01-10 DIAGNOSIS — Z01419 Encounter for gynecological examination (general) (routine) without abnormal findings: Secondary | ICD-10-CM | POA: Diagnosis not present

## 2018-01-10 DIAGNOSIS — Z6821 Body mass index (BMI) 21.0-21.9, adult: Secondary | ICD-10-CM | POA: Diagnosis not present

## 2018-01-10 DIAGNOSIS — Z13820 Encounter for screening for osteoporosis: Secondary | ICD-10-CM | POA: Diagnosis not present

## 2018-01-10 DIAGNOSIS — Z1231 Encounter for screening mammogram for malignant neoplasm of breast: Secondary | ICD-10-CM | POA: Diagnosis not present

## 2018-01-10 LAB — HM PAP SMEAR

## 2018-01-10 LAB — HM DEXA SCAN

## 2018-01-14 LAB — HM MAMMOGRAPHY

## 2018-02-04 DIAGNOSIS — M8580 Other specified disorders of bone density and structure, unspecified site: Secondary | ICD-10-CM | POA: Diagnosis not present

## 2018-02-04 DIAGNOSIS — N951 Menopausal and female climacteric states: Secondary | ICD-10-CM | POA: Diagnosis not present

## 2018-06-26 ENCOUNTER — Encounter: Payer: Self-pay | Admitting: Adult Health

## 2018-06-26 ENCOUNTER — Ambulatory Visit: Payer: BLUE CROSS/BLUE SHIELD | Admitting: Adult Health

## 2018-06-26 VITALS — BP 110/72 | HR 72 | Temp 97.9°F | Ht 68.0 in | Wt 142.0 lb

## 2018-06-26 DIAGNOSIS — J00 Acute nasopharyngitis [common cold]: Secondary | ICD-10-CM | POA: Diagnosis not present

## 2018-06-26 MED ORDER — PREDNISONE 20 MG PO TABS: ORAL_TABLET | ORAL | 0 refills | Status: DC

## 2018-06-26 MED ORDER — PROMETHAZINE-DM 6.25-15 MG/5ML PO SYRP: 5.0000 mL | ORAL_SOLUTION | Freq: Four times a day (QID) | ORAL | 1 refills | Status: DC | PRN

## 2018-06-26 MED ORDER — AMOXICILLIN 500 MG PO TABS: 500.0000 mg | ORAL_TABLET | Freq: Three times a day (TID) | ORAL | 0 refills | Status: DC

## 2018-06-26 NOTE — Patient Instructions (Signed)

## 2018-06-26 NOTE — Progress Notes (Signed)
Assessment and Plan:  Carla Morton was seen today for uri.  Diagnoses and all orders for this visit:  Acute nasopharyngitis Benign exam; Discussed the importance of avoiding unnecessary antibiotic therapy. Suggested symptomatic OTC remedies- mucinex for thick secretions, salt water gargles Nasal saline spray for congestion. Nasal steroids, allergy pill, oral steroids offered Breo sample given for mild wheezing Follow up as needed. -     predniSONE (DELTASONE) 20 MG tablet; 2 tablets daily for 3 days, 1 tablet daily for 4 days. -     promethazine-dextromethorphan (PROMETHAZINE-DM) 6.25-15 MG/5ML syrup; Take 5 mLs by mouth 4 (four) times daily as needed for cough.  Hold; fill only if progressive symptoms over the long weekend -     amoxicillin (AMOXIL) 500 MG tablet; Take 1 tablet (500 mg total) by mouth 3 (three) times daily. 10 days   Further disposition pending results of labs. Discussed med's effects and SE's.   Over 15 minutes of exam, counseling, chart review, and critical decision making was performed.   Future Appointments  Date Time Provider Department Center  09/26/2018  9:00 AM Quentin Mullingollier, Amanda, PA-C [facility] None    ------------------------------------------------------------------------------------------------------------------   HPI BP 110/72   Pulse 72   Temp 97.9 F (36.6 C)   Ht 5\' 8"  (1.727 m)   Wt 142 lb (64.4 kg)   SpO2 98%   BMI 21.59 kg/m   57 y.o.female presents for 4 days of scratchy throat, facial pressure, nasal congestion, bilateral ear pressure; she reports discharge has been clear, but then productive cough started yesterday, has had chills but not measured fever. She does endorse mild wheezing but denies dyspnea, chest pain. Denies headache, abd pain/n/v/d, rash. Does endorse mild intermittent dizziness.    She has hx of seasonal allergies, but reports she hasn't had her typical allergy problems in this past year.   She has been doing nasal saline  flushes. She has been taking mucinex sinus max, pushing fluids. She has not tried taking allergy med, or flonase.   Past Medical History:  Diagnosis Date  . Allergy   . Arthritis   . Asthma    as a child  . Diabetes (HCC)    pre-diabetic  . Diverticulosis   . GERD (gastroesophageal reflux disease)   . Hemorrhoids   . Hyperlipidemia   . Hypotension   . Vitamin D deficiency      Allergies  Allergen Reactions  . Montelukast     Other reaction(s): Other (See Comments)  . Singulair [Montelukast Sodium]   . Erythromycin Rash    Current Outpatient Medications on File Prior to Visit  Medication Sig  . B Complex-C (SUPER B COMPLEX PO) Take 1 tablet by mouth daily.  . Cholecalciferol (VITAMIN D PO) Take 5,000 Units by mouth daily.  . Estradiol-Norethindrone Acet (ACTIVELLA) 0.5-0.1 MG tablet Take 1 tablet by mouth daily.  Marland Kitchen. gabapentin (NEURONTIN) 300 MG capsule 1-2 pills at night for hot flashes  . Homeopathic Products (HYLAFEM) SUPP Place 1 applicator vaginally 2 (two) times a week.  . Multiple Vitamins-Minerals (CENTRUM SILVER PO) Take 1 tablet by mouth daily.  . Probiotic Product (PROBIOTIC DAILY PO) Take by mouth daily. Digestive Advantage  . ranitidine (ZANTAC) 150 MG tablet Take 150 mg by mouth.  . TURMERIC PO Take 1,000 mg by mouth daily.  . cetirizine (ZYRTEC) 10 MG tablet Take 10 mg by mouth daily.  . fluticasone (FLONASE) 50 MCG/ACT nasal spray Place 2 sprays into both nostrils daily.   No current facility-administered medications  on file prior to visit.     ROS: all negative except above.   Physical Exam:  BP 110/72   Pulse 72   Temp 97.9 F (36.6 C)   Ht 5\' 8"  (1.727 m)   Wt 142 lb (64.4 kg)   SpO2 98%   BMI 21.59 kg/m   General Appearance: Well nourished, in no apparent distress. Eyes: PERRLA, EOMs, conjunctiva no swelling or erythema Sinuses: No Frontal/maxillary tenderness ENT/Mouth: Ext aud canals clear, TMs without erythema, bulging. No erythema,  swelling, or exudate on post pharynx.  Tonsils not swollen or erythematous. Hearing normal.  Neck: Supple, thyroid normal.  Respiratory: Respiratory effort normal, BS equal bilaterally without rales, rhonchi, wheezing or stridor.  Cardio: RRR with no MRGs. Brisk peripheral pulses without edema.  Abdomen: Soft, + BS.  Non tender, no guarding, rebound, hernias, masses. Lymphatics: Non tender without lymphadenopathy.  Musculoskeletal: Full ROM, 5/5 strength, normal gait.  Skin: Warm, dry without rashes, lesions, ecchymosis.  Neuro: Cranial nerves intact. Normal muscle tone, no cerebellar symptoms. Sensation intact.  Psych: Awake and oriented X 3, normal affect, Insight and Judgment appropriate.     Dan Maker, NP 12:59 PM [facility]

## 2018-09-25 NOTE — Progress Notes (Signed)
Complete Physical  Assessment and Plan: Hyperlipidemia -continue medications, check lipids, decrease fatty foods, increase activity.  - CBC with Differential/Platelet - BASIC METABOLIC PANEL WITH GFR - Hepatic function panel - TSH - Lipid panel  Allergic rhinitis, seasonal Continue flonase  Deflected nasal septum Follows ENT  Encounter for general adult medical examination with abnormal findings 58 year Will get MGM/DEXA/and PAP report  WILL DO MINIMAL LABS DUE TO INSURANCE  Discussed med's effects and SE's. Screening labs and tests as requested with regular follow-up as recommended. Future Appointments  Date Time Provider Department Center  09/30/2019  9:00 AM Quentin Mulling, PA-C [facility] None    HPI 58 y.o. female  presents for a complete physical. Her blood pressure has been controlled at home, today their BP is BP: 122/86   She denies chest pain, shortness of breath. She does workout and is very physical at her job.  She is dreaming of work and not sleeping well. She is having hot flashes at night and during the day, she is on estrogen/progeterone every other day.  She is on activella, trying to do every other day but having hot flashes again. [facility].  She is on boric acid occ for yeast.  Her cholesterol is diet controlled.  Her cholesterol is at goal. The cholesterol last visit was:  Lab Results  Component Value Date   CHOL 229 (H) 09/25/2017   HDL 86 09/25/2017   LDLCALC 125 (H) 09/25/2017   TRIG 85 09/25/2017   CHOLHDL 2.7 09/25/2017   She has been working on diet and exercise for prediabetes, and denies blurry vision, polydipsia, polyphagia and polyuria. Last A1C in the office was:  Lab Results  Component Value Date   HGBA1C 5.3 09/25/2016   Patient is on Vitamin D supplement, 5000 IU daily  Lab Results  Component Value Date   VD25OH 67 09/25/2016   BMI is Body mass index is 21.13 kg/m., she is working on diet  and exercise. Wt Readings from Last 3 Encounters:  09/26/18 139 lb (63 kg)  06/26/18 142 lb (64.4 kg)  09/25/17 139 lb 3.2 oz (63.1 kg)    Current Medications:  Current Outpatient Medications on File Prior to Visit  Medication Sig Dispense Refill  . B Complex-C (SUPER B COMPLEX PO) Take 1 tablet by mouth daily.    . cetirizine (ZYRTEC) 10 MG tablet Take 10 mg by mouth daily.    . Cholecalciferol (VITAMIN D PO) Take 5,000 Units by mouth daily.    . Estradiol-Norethindrone Acet (ACTIVELLA) 0.5-0.1 MG tablet Take 1 tablet by mouth daily. 30 tablet 3  . fluticasone (FLONASE) 50 MCG/ACT nasal spray Place 2 sprays into both nostrils daily.    Marland Kitchen gabapentin (NEURONTIN) 300 MG capsule 1-2 pills at night for hot flashes 60 capsule 2  . Homeopathic Products (HYLAFEM) SUPP Place 1 applicator vaginally 2 (two) times a week. 21 suppository 3  . Multiple Vitamins-Minerals (CENTRUM SILVER PO) Take 1 tablet by mouth daily.    . Probiotic Product (PROBIOTIC DAILY PO) Take by mouth daily. Digestive Advantage    . ranitidine (ZANTAC) 150 MG tablet Take 150 mg by mouth.    . TURMERIC PO Take 1,000 mg by mouth daily.     No current facility-administered medications on file prior to visit.    Health Maintenance:  Immunization History  Administered Date(s) Administered  . Influenza Inj Mdck Quad Pf 06/09/2017  . Influenza Split 06/09/2014  . Influenza,inj,Quad  PF,6+ Mos 05/17/2015, 05/17/2018  . Influenza-Unspecified 05/26/2013, 05/17/2018  . Pneumococcal Conjugate-13 06/09/2014  . Pneumococcal-Unspecified 08/06/2010  . Tdap 12/21/2011   Tetanus: 2013 Pneumovax: 2012 Prevnar 13: 2015- had a reaction Flu vaccine: [facility] Zostavax:N/A  Pap: May 2019 Dr. Vickey Sages, yearly MGM: May 2019 at GYN office DEXA: 12/2013- normal CXR 08/2014 Colonoscopy: 11/2017 Dr. Tanna Savoy [facility] EGD: 2000 with Dr. Marina Goodell, + [facility] (Dr. Luetta Nutting) DEE 3 years in [place]  Medical History:  Past Medical  History:  Diagnosis Date  . Allergy   . Arthritis   . Asthma    as a child  . Diabetes (HCC)    pre-diabetic  . Diverticulosis   . GERD (gastroesophageal reflux disease)   . Hemorrhoids   . Hyperlipidemia   . Hypotension   . Vitamin D deficiency    Allergies Allergies  Allergen Reactions  . Montelukast     Other reaction(s): Other (See Comments)  . Singulair [Montelukast Sodium]   . Erythromycin Rash    SURGICAL HISTORY She  has a past surgical history that includes Nasal septum surgery; Carpal tunnel release (Bilateral); and Colonoscopy (07/2011). FAMILY HISTORY Her family history includes Cancer (age of onset: 74) in her father; Colon cancer in her maternal grandmother; Other in her father. SOCIAL HISTORY She  reports that she quit smoking about 43 years ago. Her smoking use included cigarettes. She has a 8.00 pack-year smoking history. She has never used smokeless tobacco. She reports current alcohol use. She reports current drug use. Drug: Marijuana.  ROS Review of Systems  HENT: Negative.   Eyes: Negative.   Respiratory: Negative.  Negative for cough, shortness of breath and wheezing.   Cardiovascular: Negative for chest pain, palpitations, orthopnea, claudication, leg swelling and PND.  Gastrointestinal: Negative.   Genitourinary: Negative.        + recurrent yeast  Musculoskeletal: Positive for joint pain (right elbow) and neck pain. Negative for back pain and myalgias.  Skin: Negative for itching and rash.  Neurological: Negative for dizziness, tingling, tremors, sensory change, speech change, focal weakness, seizures and loss of consciousness.  Psychiatric/Behavioral: Negative.     Physical Exam: Estimated body mass index is 21.13 kg/m as calculated from the following:   Height as of this encounter: 5\' 8"  (1.727 m).   Weight as of this encounter: 139 lb (63 kg). Vitals:   09/26/18 0848  BP: 122/86  Pulse: 81  Temp: (!) 97.1 F (36.2 C)  SpO2: 99%    General Appearance: Well nourished, in no apparent distress. Eyes: PERRLA, EOMs, conjunctiva no swelling or erythema, normal fundi and vessels. Sinuses: No Frontal/maxillary tenderness ENT/Mouth: Ext aud canals clear, normal light reflex with TMs without erythema, bulging.  Good dentition. No erythema, swelling, or exudate on post pharynx. Tonsils not swollen or erythematous. Hearing normal.  Neck: Supple, thyroid normal. No bruits Respiratory: Respiratory effort normal, BS equal bilaterally without rales, rhonchi, wheezing or stridor. Cardio: RRR without murmurs, rubs or gallops. Brisk peripheral pulses without edema.  Chest: symmetric, with normal excursions and percussion. Breasts: defer Abdomen: Soft, +BS. Non tender, no guarding, rebound, hernias, masses, or organomegaly. .  Lymphatics: Non tender without lymphadenopathy.  Genitourinary: defer Musculoskeletal: Full ROM all peripheral extremities,5/5 strength, and normal gait. Skin: Warm, dry without rashes, lesions, ecchymosis. Neuro: Cranial nerves intact, reflexes equal bilaterally. Normal muscle tone, no cerebellar symptoms. Sensation intact.  Psych: Awake and oriented X 3, normal affect, Insight and Judgment appropriate.   EKG: DECLINES THIS YEAR IRBBB,  nonspecific ST changes   Quentin MullingAmanda Laneya Gasaway 9:11 AM

## 2018-09-26 ENCOUNTER — Encounter: Payer: Self-pay | Admitting: Physician Assistant

## 2018-09-26 ENCOUNTER — Ambulatory Visit: Payer: BLUE CROSS/BLUE SHIELD | Admitting: Physician Assistant

## 2018-09-26 VITALS — BP 122/86 | HR 81 | Temp 97.1°F | Ht 68.0 in | Wt 139.0 lb

## 2018-09-26 DIAGNOSIS — Z1329 Encounter for screening for other suspected endocrine disorder: Secondary | ICD-10-CM

## 2018-09-26 DIAGNOSIS — Z13220 Encounter for screening for lipoid disorders: Secondary | ICD-10-CM

## 2018-09-26 DIAGNOSIS — Z0000 Encounter for general adult medical examination without abnormal findings: Secondary | ICD-10-CM

## 2018-09-26 DIAGNOSIS — M5030 Other cervical disc degeneration, unspecified cervical region: Secondary | ICD-10-CM | POA: Insufficient documentation

## 2018-09-26 DIAGNOSIS — E559 Vitamin D deficiency, unspecified: Secondary | ICD-10-CM | POA: Diagnosis not present

## 2018-09-26 DIAGNOSIS — Z1389 Encounter for screening for other disorder: Secondary | ICD-10-CM

## 2018-09-26 DIAGNOSIS — E785 Hyperlipidemia, unspecified: Secondary | ICD-10-CM

## 2018-09-26 DIAGNOSIS — J302 Other seasonal allergic rhinitis: Secondary | ICD-10-CM

## 2018-09-26 DIAGNOSIS — R7303 Prediabetes: Secondary | ICD-10-CM

## 2018-09-26 DIAGNOSIS — Z79899 Other long term (current) drug therapy: Secondary | ICD-10-CM

## 2018-09-26 NOTE — Patient Instructions (Addendum)
Try the gabapentin 1-2 pills an hour to two before bed for hot flashes/sleep   Bunion  A bunion is a bump on the base of the big toe that forms when the bones of the big toe joint move out of position. Bunions may be small at first, but they often get larger over time. They can make walking painful. What are the causes? A bunion may be caused by:  Wearing narrow or pointed shoes that force the big toe to press against the other toes.  Abnormal foot development that causes the foot to roll inward (pronate).  Changes in the foot that are caused by certain diseases, such as rheumatoid arthritis or polio.  A foot injury. What increases the risk? The following factors may make you more likely to develop this condition:  Wearing shoes that squeeze the toes together.  Having certain diseases, such as: ? Rheumatoid arthritis. ? Polio. ? Cerebral palsy.  Having family members who have bunions.  Being born with a foot deformity, such as flat feet or low arches.  Doing activities that put a lot of pressure on the feet, such as ballet dancing. What are the signs or symptoms? The main symptom of a bunion is a noticeable bump on the big toe. Other symptoms may include:  Pain.  Swelling around the big toe.  Redness and inflammation.  Thick or hardened skin on the big toe or between the toes.  Stiffness or loss of motion in the big toe.  Trouble with walking. How is this diagnosed? A bunion may be diagnosed based on your symptoms, medical history, and activities. You may have tests, such as:  X-rays. These allow your health care provider to check the position of the bones in your foot and look for damage to your joint. They also help your health care provider determine the severity of your bunion and the best way to treat it.  Joint aspiration. In this test, a sample of fluid is removed from the toe joint. This test may be done if you are in a lot of pain. It helps rule out diseases  that cause painful swelling of the joints, such as arthritis. How is this treated? Treatment depends on the severity of your symptoms. The goal of treatment is to relieve symptoms and prevent the bunion from getting worse. Your health care provider may recommend:  Wearing shoes that have a wide toe box.  Using bunion pads to cushion the affected area.  Taping your toes together to keep them in a normal position.  Placing a device inside your shoe (orthotics) to help reduce pressure on your toe joint.  Taking medicine to ease pain, inflammation, and swelling.  Applying heat or ice to the affected area.  Doing stretching exercises.  Surgery to remove scar tissue and move the toes back into their normal position. This treatment is rare. Follow these instructions at home: Managing pain, stiffness, and swelling   If directed, put ice on the painful area: ? Put ice in a plastic bag. ? Place a towel between your skin and the bag. ? Leave the ice on for 20 minutes, 2-3 times a day. Activity   If directed, apply heat to the affected area before you exercise. Use the heat source that your health care provider recommends, such as a moist heat pack or a heating pad. ? Place a towel between your skin and the heat source. ? Leave the heat on for 20-30 minutes. ? Remove the heat if  your skin turns bright red. This is especially important if you are unable to feel pain, heat, or cold. You may have a greater risk of getting burned.  Do exercises as told by your health care provider. General instructions  Support your toe joint with proper footwear, shoe padding, or taping as told by your health care provider.  Take over-the-counter and prescription medicines only as told by your health care provider.  Keep all follow-up visits as told by your health care provider. This is important. Contact a health care provider if your symptoms:  Get worse.  Do not improve in 2 weeks. Get help right  away if you have:  Severe pain and trouble with walking. Summary  A bunion is a bump on the base of the big toe that forms when the bones of the big toe joint move out of position.  Bunions can make walking painful.  Treatment depends on the severity of your symptoms.  Support your toe joint with proper footwear, shoe padding, or taping as told by your health care provider. This information is not intended to replace advice given to you by your health care provider. Make sure you discuss any questions you have with your health care provider. Document Released: 07/17/2005 Document Revised: 11/27/2017 Document Reviewed: 11/27/2017 Elsevier Interactive Patient Education  2019 [employer].

## 2018-09-27 LAB — CBC WITH DIFFERENTIAL/PLATELET
ABSOLUTE MONOCYTES: 520 {cells}/uL (ref 200–950)
BASOS ABS: 41 {cells}/uL (ref 0–200)
BASOS PCT: 0.8 %
Eosinophils Absolute: 92 cells/uL (ref 15–500)
Eosinophils Relative: 1.8 %
HEMATOCRIT: 38.5 % (ref 35.0–45.0)
HEMOGLOBIN: 12.9 g/dL (ref 11.7–15.5)
LYMPHS ABS: 1591 {cells}/uL (ref 850–3900)
MCH: 31.5 pg (ref 27.0–33.0)
MCHC: 33.5 g/dL (ref 32.0–36.0)
MCV: 93.9 fL (ref 80.0–100.0)
MPV: 9.9 fL (ref 7.5–12.5)
Monocytes Relative: 10.2 %
NEUTROS ABS: 2856 {cells}/uL (ref 1500–7800)
Neutrophils Relative %: 56 %
Platelets: 260 10*3/uL (ref 140–400)
RBC: 4.1 10*6/uL (ref 3.80–5.10)
RDW: 12.3 % (ref 11.0–15.0)
Total Lymphocyte: 31.2 %
WBC: 5.1 10*3/uL (ref 3.8–10.8)

## 2018-09-27 LAB — COMPLETE METABOLIC PANEL WITH GFR
AG Ratio: 1.9 (calc) (ref 1.0–2.5)
ALT: 16 U/L (ref 6–29)
AST: 22 U/L (ref 10–35)
Albumin: 4.8 g/dL (ref 3.6–5.1)
Alkaline phosphatase (APISO): 48 U/L (ref 37–153)
BUN: 16 mg/dL (ref 7–25)
CALCIUM: 9.7 mg/dL (ref 8.6–10.4)
CO2: 27 mmol/L (ref 20–32)
Chloride: 104 mmol/L (ref 98–110)
Creat: 0.81 mg/dL (ref 0.50–1.05)
GFR, EST AFRICAN AMERICAN: 93 mL/min/{1.73_m2} (ref >=60)
GFR, EST NON AFRICAN AMERICAN: 80 mL/min/{1.73_m2} (ref >=60)
GLUCOSE: 77 mg/dL (ref 65–99)
Globulin: 2.5 g/dL (calc) (ref 1.9–3.7)
Potassium: 4.3 mmol/L (ref 3.5–5.3)
Sodium: 140 mmol/L (ref 135–146)
TOTAL PROTEIN: 7.3 g/dL (ref 6.1–8.1)
Total Bilirubin: 0.5 mg/dL (ref 0.2–1.2)

## 2018-09-27 LAB — TSH: TSH: 1.05 mIU/L (ref 0.40–4.50)

## 2018-09-27 LAB — LIPID PANEL
CHOL/HDL RATIO: 2.4 (calc) (ref <5.0)
Cholesterol: 212 mg/dL — ABNORMAL HIGH (ref <200)
HDL: 88 mg/dL (ref >=50)
LDL Cholesterol (Calc): 109 mg/dL (calc) — ABNORMAL HIGH
NON-HDL CHOLESTEROL (CALC): 124 mg/dL (ref <130)
TRIGLYCERIDES: 66 mg/dL (ref <150)

## 2018-09-27 LAB — VITAMIN D 25 HYDROXY (VIT D DEFICIENCY, FRACTURES): Vit D, 25-Hydroxy: 68 ng/mL (ref 30–100)

## 2019-02-17 DIAGNOSIS — Z01419 Encounter for gynecological examination (general) (routine) without abnormal findings: Secondary | ICD-10-CM | POA: Diagnosis not present

## 2019-02-17 DIAGNOSIS — Z1231 Encounter for screening mammogram for malignant neoplasm of breast: Secondary | ICD-10-CM | POA: Diagnosis not present

## 2019-02-17 DIAGNOSIS — Z6821 Body mass index (BMI) 21.0-21.9, adult: Secondary | ICD-10-CM | POA: Diagnosis not present

## 2019-05-19 DIAGNOSIS — D2262 Melanocytic nevi of left upper limb, including shoulder: Secondary | ICD-10-CM | POA: Diagnosis not present

## 2019-05-19 DIAGNOSIS — L570 Actinic keratosis: Secondary | ICD-10-CM | POA: Diagnosis not present

## 2019-05-19 DIAGNOSIS — Z85828 Personal history of other malignant neoplasm of skin: Secondary | ICD-10-CM | POA: Diagnosis not present

## 2019-05-19 DIAGNOSIS — L821 Other seborrheic keratosis: Secondary | ICD-10-CM | POA: Diagnosis not present

## 2019-05-19 DIAGNOSIS — L820 Inflamed seborrheic keratosis: Secondary | ICD-10-CM | POA: Diagnosis not present

## 2019-05-19 DIAGNOSIS — D2272 Melanocytic nevi of left lower limb, including hip: Secondary | ICD-10-CM | POA: Diagnosis not present

## 2019-09-29 NOTE — Progress Notes (Signed)
Complete Physical  Assessment and Plan:  Encounter for general adult medical examination with abnormal findings 1 year Will get MGM/DEXA/and PAP report  Hyperlipidemia, unspecified hyperlipidemia type -     TSH -     Lipid panel check lipids decrease fatty foods increase activity.  Abnormal glucose -     TSH Discussed disease progression and risks Discussed diet/exercise, weight management and risk modification  Vitamin D deficiency -     VITAMIN D 25 Hydroxy (Vit-D Deficiency, Fractures)  Medication management -     CBC with Differential/Platelet -     COMPLETE METABOLIC PANEL WITH GFR -     Magnesium  Screening for blood or protein in urine -     Urinalysis, Routine w reflex microscopic -     Microalbumin / creatinine urine ratio  Deflected nasal septum Continue ENT prn  Seasonal allergic rhinitis, unspecified trigger Continue OTC meds  DDD (degenerative disc disease), cervical Monitor  Grief Given counseling number No meds at this time Good support She will call if she needs anything  Chronic left shoulder pain and right knee pain -     Ambulatory referral to Orthopedics - possible rotator cuff/deltoid injury to left shoulder and right knee likely meniscal tear with mechanism of action- 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 - with depression/grief, do not try to taper at this time- continue 1 pill every other day    Discussed med's effects and SE's. Screening labs and tests as requested with regular follow-up as recommended. Future Appointments  Date Time Provider [place]  09/29/2020  9:00 AM Vicie Mutters, PA-C [id number] None    HPI 59 y.o. female  presents for a complete physical. Her blood pressure has been controlled at home, today their BP is BP: 102/60   Brother died at 26 from cirrhosis, colon cancer,  found out 09-06-2024and died 13. Sister's fiance was murdered "[employer]", dad died 2022/09/14 from dementia and fracture. Boyfriend, mark, has been very supportive. She is still very tearful, states will cry randomly while driving.   Right handed female, states April last year she wired a new house and started to have left shoulder pain. She has pain with abduction, external rotation, and pain is pin point at deltoid/biceps. Has good days/ bad days, worse with reaching behind her. Denies weakness in his arms, trouble with grip.   She also describes a twisting mechanism with her right knee, pain at her knee immediately. Having popping, catching in that right knee with walking. Wearing a brace that helps some.   She denies chest pain, shortness of breath. She does workout and is very physical at her job.  BMI is Body mass index is 21.42 kg/m., she is working on diet and exercise. Wt Readings from Last 3 Encounters:  09/30/19 138 lb 12.8 oz (63 kg)  09/26/18 139 lb (63 kg)  06/26/18 142 lb (64.4 kg)    She is having hot flashes at night and during the day, not sleeping well due to this and her GYN wants her to cut back however she is struggling with this due to grief, she is on estrogen/progeterone every other day.  [facility]  She is on boric acid occ for yeast.   Her cholesterol is diet controlled.  Her cholesterol is at goal. The  cholesterol last visit was:  Lab Results  Component Value Date   CHOL 212 (H) 09/26/2018   HDL 88 09/26/2018   LDLCALC 109 (H) 09/26/2018   TRIG 66 09/26/2018   CHOLHDL 2.4 09/26/2018   She has been working on diet and exercise for prediabetes, and denies blurry vision, polydipsia, polyphagia and polyuria. Last A1C in the office was:  Lab Results  Component Value Date   HGBA1C 5.3 09/25/2016  8 Patient is on Vitamin D supplement, 5000 IU daily  Lab Results  Component Value Date   VD25OH 68 09/26/2018     Current Medications:  Current Outpatient Medications on File Prior to Visit  Medication Sig Dispense Refill  . B Complex-C (SUPER B COMPLEX PO) Take 1 tablet by mouth daily.    . cetirizine (ZYRTEC) 10 MG tablet Take 10 mg by mouth daily.    . Cholecalciferol (VITAMIN D PO) Take 5,000 Units by mouth daily.    . fluticasone (FLONASE) 50 MCG/ACT nasal spray Place 2 sprays into both nostrils daily.    . Homeopathic Products (HYLAFEM) SUPP Place 1 applicator vaginally 2 (two) times a week. 21 suppository 3  . Multiple Vitamins-Minerals (CENTRUM SILVER PO) Take 1 tablet by mouth daily.    . TURMERIC PO Take 1,000 mg by mouth daily.    . Probiotic Product (PROBIOTIC DAILY PO) Take by mouth daily. Digestive Advantage    . ranitidine (ZANTAC) 150 MG tablet Take 150 mg by mouth.     No current facility-administered medications on file prior to visit.   Health Maintenance:  Immunization History  Administered Date(s) Administered  . Influenza Inj Mdck Quad Pf 06/09/2017, 05/09/2019  . Influenza Split 06/09/2014  . Influenza,inj,Quad PF,6+ Mos 05/17/2015, 05/17/2018  . Influenza-Unspecified 05/26/2013, 05/17/2018  . Pneumococcal Conjugate-13 06/09/2014  . Pneumococcal-Unspecified 08/06/2010  . Tdap 12/21/2011   Tetanus: 2013 Pneumovax: 2012 Prevnar 13: 2015- had a reaction Flu vaccine: 2019 at [facility] Zostavax:N/A  Pap: June 2020 Dr. Vickey Sages, yearly MGM: June 2020 at GYN office DEXA: 12/2017- normal CXR 08/2014- repeat at age 6 Colonoscopy: 11/2017 Dr. Tanna Savoy [facility]- due 5 years EGD: 2000 with Dr. Marina Goodell, + [facility] (Dr. Luetta Nutting) DEE 3 years in [place]  Medical History:  Past Medical History:  Diagnosis Date  . Allergy   . Arthritis   . Asthma    as a child  . Diabetes (HCC)    pre-diabetic  . Diverticulosis   . GERD (gastroesophageal reflux disease)   . Hemorrhoids   . Hyperlipidemia   . Hypotension   . Vitamin D deficiency    Allergies Allergies   Allergen Reactions  . Montelukast     Other reaction(s): Other (See Comments)  . Singulair [Montelukast Sodium]   . Erythromycin Rash    SURGICAL HISTORY She  has a past surgical history that includes Nasal septum surgery; Carpal tunnel release (Bilateral); and Colonoscopy (07/2011). FAMILY HISTORY Her family history includes Arthritis in her sister and sister; Cancer (age of onset: 52) in her brother; Cancer (age of onset: 4) in her father; Cirrhosis in her brother; Colon cancer in her maternal grandmother; Dementia in her father; Other in her father. SOCIAL HISTORY She  reports that she quit smoking about 44 years ago. Her smoking use included cigarettes. She has a 8.00 pack-year smoking history. She has never used smokeless tobacco. She reports current alcohol use. She reports current drug use. Drug: Marijuana.  ROS Review of Systems  HENT: Negative.   Eyes: Negative.  Respiratory: Negative.  Negative for cough, shortness of breath and wheezing.   Cardiovascular: Negative for chest pain, palpitations, orthopnea, claudication, leg swelling and PND.  Gastrointestinal: Negative.   Genitourinary: Negative.        + recurrent yeast  Musculoskeletal: Positive for joint pain (left shoulder). Negative for back pain, myalgias and neck pain.  Skin: Negative for itching and rash.  Neurological: Negative for dizziness, tingling, tremors, sensory change, speech change, focal weakness, seizures and loss of consciousness.  Psychiatric/Behavioral: Negative.     Physical Exam: Estimated body mass index is 21.42 kg/m as calculated from the following:   Height as of this encounter: 5' 7.5" (1.715 m).   Weight as of this encounter: 138 lb 12.8 oz (63 kg). Vitals:   09/30/19 0849  BP: 102/60  Pulse: 69  Temp: 97.7 F (36.5 C)  SpO2: 99%   General Appearance: Well nourished, in no apparent distress. Eyes: PERRLA, EOMs, conjunctiva no swelling or erythema, normal fundi and  vessels. Sinuses: No Frontal/maxillary tenderness ENT/Mouth: Ext aud canals clear, normal light reflex with TMs without erythema, bulging.  Good dentition. No erythema, swelling, or exudate on post pharynx. Tonsils not swollen or erythematous. Hearing normal.  Neck: Supple, thyroid normal. No bruits Respiratory: Respiratory effort normal, BS equal bilaterally without rales, rhonchi, wheezing or stridor. Cardio: RRR without murmurs, rubs or gallops. Brisk peripheral pulses without edema.  Chest: symmetric, with normal excursions and percussion. Breasts: defer Abdomen: Soft, +BS. Non tender, no guarding, rebound, hernias, masses, or organomegaly. .  Lymphatics: Non tender without lymphadenopathy.  Genitourinary: defer Musculoskeletal: Full ROM all peripheral extremities,5/5 strength, and normal gait. Patient with left shoulder pain with abduction to 80 degrees, pain with exertnal rotation. No laxity right knee, + mcMurry's.  Skin: Warm, dry without rashes, lesions, ecchymosis. Neuro: Cranial nerves intact, reflexes equal bilaterally. Normal muscle tone, no cerebellar symptoms. Sensation intact.  Psych: Awake and oriented X 3, normal affect, Insight and Judgment appropriate.   EKG: DECLINES THIS YEAR IRBBB, nonspecific ST changes   Quentin Mulling 12:51 PM

## 2019-09-30 ENCOUNTER — Encounter: Payer: Self-pay | Admitting: Physician Assistant

## 2019-09-30 ENCOUNTER — Ambulatory Visit: Payer: 59 | Admitting: Physician Assistant

## 2019-09-30 ENCOUNTER — Other Ambulatory Visit: Payer: Self-pay

## 2019-09-30 VITALS — BP 102/60 | HR 69 | Temp 97.7°F | Ht 67.5 in | Wt 138.8 lb

## 2019-09-30 DIAGNOSIS — Z1329 Encounter for screening for other suspected endocrine disorder: Secondary | ICD-10-CM

## 2019-09-30 DIAGNOSIS — Z1389 Encounter for screening for other disorder: Secondary | ICD-10-CM

## 2019-09-30 DIAGNOSIS — F4321 Adjustment disorder with depressed mood: Secondary | ICD-10-CM

## 2019-09-30 DIAGNOSIS — Z0001 Encounter for general adult medical examination with abnormal findings: Secondary | ICD-10-CM

## 2019-09-30 DIAGNOSIS — E559 Vitamin D deficiency, unspecified: Secondary | ICD-10-CM | POA: Diagnosis not present

## 2019-09-30 DIAGNOSIS — Z0000 Encounter for general adult medical examination without abnormal findings: Secondary | ICD-10-CM | POA: Diagnosis not present

## 2019-09-30 DIAGNOSIS — J302 Other seasonal allergic rhinitis: Secondary | ICD-10-CM

## 2019-09-30 DIAGNOSIS — Z79899 Other long term (current) drug therapy: Secondary | ICD-10-CM | POA: Diagnosis not present

## 2019-09-30 DIAGNOSIS — M25512 Pain in left shoulder: Secondary | ICD-10-CM

## 2019-09-30 DIAGNOSIS — E785 Hyperlipidemia, unspecified: Secondary | ICD-10-CM

## 2019-09-30 DIAGNOSIS — Z13220 Encounter for screening for lipoid disorders: Secondary | ICD-10-CM

## 2019-09-30 DIAGNOSIS — N951 Menopausal and female climacteric states: Secondary | ICD-10-CM

## 2019-09-30 DIAGNOSIS — M5030 Other cervical disc degeneration, unspecified cervical region: Secondary | ICD-10-CM

## 2019-09-30 DIAGNOSIS — J342 Deviated nasal septum: Secondary | ICD-10-CM

## 2019-09-30 DIAGNOSIS — R7309 Other abnormal glucose: Secondary | ICD-10-CM

## 2019-09-30 DIAGNOSIS — G8929 Other chronic pain: Secondary | ICD-10-CM

## 2019-09-30 DIAGNOSIS — M25561 Pain in right knee: Secondary | ICD-10-CM

## 2019-09-30 MED ORDER — ESTRADIOL-NORETHINDRONE ACET 1-0.5 MG PO TABS: 1.0000 | ORAL_TABLET | Freq: Every day | ORAL | 6 refills | Status: AC

## 2019-09-30 NOTE — Patient Instructions (Addendum)
[facility] Address: [address], [place], [place] [address]  Phone: [phone]  Will refer to ortho for your shoulder Can refer to PT for right knee.   Meniscus Tear, Phase I Rehab Ask your health care provider which exercises are safe for you. Do exercises exactly as told by your health care provider and adjust them as directed. It is normal to feel mild stretching, pulling, tightness, or discomfort as you do these exercises. Stop right away if you feel sudden pain or your pain gets worse. Do not begin these exercises until told by your health care provider. Stretching and range-of-motion exercises These exercises warm up your muscles and joints and improve the movement and flexibility of your knee. These exercises also help to relieve pain and stiffness. Active knee flexion, supine  1. Lie on your back (supine position) with both knees straight. If this causes back discomfort, bend your uninjured knee so your foot is flat on the floor. 2. Slowly slide your left / right heel back toward your buttocks (active) until you feel a gentle stretch in the front of your knee or thigh (flexion). Stop if you have pain. 3. Hold this position for __________ seconds. 4. Slowly slide your left / right heel back to the starting position. Repeat __________ times. Complete this exercise __________ times a day. Passive knee extension, sitting  1. Sit with your left / right heel propped on a chair, a coffee table, or a footstool. Do not have anything under your knee to support it. 2. Without using any effort (passive), allow your leg muscles to relax, letting gravity straighten out your knee (extension). Do not let your knee roll inward. You should feel a stretch behind your left / right knee. 3. If told by your health care provider, deepen the stretch by placing a __________ weight on your thigh, just above your kneecap. 4. Hold this position for __________ seconds. Repeat __________  times. Complete this exercise __________ times a day. Strengthening exercises These exercises build strength and endurance in your knee. Endurance is the ability to use your muscles for a long time, even after they get tired. Quadriceps, isometric  1. Lie on your back with your left / right leg extended and your other knee bent. Put a rolled towel or a small pillow under your left / right knee if told by your health care provider. 2. Without moving your knee joint (isometric), slowly tense the muscles in the front of your left / right thigh (quadriceps). You should see your kneecap slide up toward your hip or see increased dimpling just above the knee. This motion will push the back of your knee toward the floor. 3. For __________ seconds, hold the muscle as tight as you can without increasing your pain. 4. Relax the muscles slowly and completely. Repeat __________ times. Complete this exercise __________ times a day. Straight leg raises, supine This exercise strengthens the muscles in the front of your thigh (quadriceps). 1. Lie on your back (supine position) with your left / right leg extended and your other knee bent. 2. Tense the muscles in the front of your left / right thigh. You should see your kneecap slide up toward your hip or see increased dimpling just above the knee. 3. Keep these muscles tight as you raise your leg 4-6 inches (10-15 cm) off the floor. Do not let your knee bend. 4. Hold this position for __________ seconds. 5. Keep these muscles tense as you lower your leg.  6. Relax the muscles slowly and completely after each repetition. Repeat __________ times. Complete this exercise __________ times a day. Hamstring curls  1. On the floor or a bed, lie on your abdomen with your legs straight. Put a folded towel or a small pillow under your left / right thigh, just above your kneecap. 2. Slowly bend your left / right knee as far as you can without pain. Keep your hips flat  against the floor or bed. 3. Hold this position for __________ seconds. 4. Slowly lower your leg to the starting position. Repeat __________ times. Complete this exercise __________ times a day. This information is not intended to replace advice given to you by your health care provider. Make sure you discuss any questions you have with your health care provider. Document Revised: 11/08/2018 Document Reviewed: 05/28/2018 Elsevier Patient Education  2020 [employer].

## 2019-10-01 LAB — COMPLETE METABOLIC PANEL WITH GFR
AG Ratio: 2 (calc) (ref 1.0–2.5)
ALT: 14 U/L (ref 6–29)
AST: 18 U/L (ref 10–35)
Albumin: 4.4 g/dL (ref 3.6–5.1)
Alkaline phosphatase (APISO): 44 U/L (ref 37–153)
BUN: 14 mg/dL (ref 7–25)
CO2: 29 mmol/L (ref 20–32)
Calcium: 9.5 mg/dL (ref 8.6–10.4)
Chloride: 107 mmol/L (ref 98–110)
Creat: 0.63 mg/dL (ref 0.50–1.05)
GFR, Est African American: 114 mL/min/{1.73_m2} (ref >=60)
GFR, Est Non African American: 98 mL/min/{1.73_m2} (ref >=60)
Globulin: 2.2 g/dL (calc) (ref 1.9–3.7)
Glucose, Bld: 76 mg/dL (ref 65–99)
Potassium: 4.1 mmol/L (ref 3.5–5.3)
Sodium: 139 mmol/L (ref 135–146)
Total Bilirubin: 0.5 mg/dL (ref 0.2–1.2)
Total Protein: 6.6 g/dL (ref 6.1–8.1)

## 2019-10-01 LAB — CBC WITH DIFFERENTIAL/PLATELET
Absolute Monocytes: 451 cells/uL (ref 200–950)
Basophils Absolute: 21 cells/uL (ref 0–200)
Basophils Relative: 0.5 %
Eosinophils Absolute: 70 cells/uL (ref 15–500)
Eosinophils Relative: 1.7 %
HCT: 36.1 % (ref 35.0–45.0)
Hemoglobin: 12.2 g/dL (ref 11.7–15.5)
Lymphs Abs: 1599 cells/uL (ref 850–3900)
MCH: 32 pg (ref 27.0–33.0)
MCHC: 33.8 g/dL (ref 32.0–36.0)
MCV: 94.8 fL (ref 80.0–100.0)
MPV: 9.9 fL (ref 7.5–12.5)
Monocytes Relative: 11 %
Neutro Abs: 1960 cells/uL (ref 1500–7800)
Neutrophils Relative %: 47.8 %
Platelets: 242 10*3/uL (ref 140–400)
RBC: 3.81 10*6/uL (ref 3.80–5.10)
RDW: 11.9 % (ref 11.0–15.0)
Total Lymphocyte: 39 %
WBC: 4.1 10*3/uL (ref 3.8–10.8)

## 2019-10-01 LAB — MAGNESIUM: Magnesium: 2.2 mg/dL (ref 1.5–2.5)

## 2019-10-01 LAB — VITAMIN D 25 HYDROXY (VIT D DEFICIENCY, FRACTURES): Vit D, 25-Hydroxy: 65 ng/mL (ref 30–100)

## 2019-10-01 LAB — URINALYSIS, ROUTINE W REFLEX MICROSCOPIC
Bilirubin Urine: NEGATIVE
Glucose, UA: NEGATIVE
Hgb urine dipstick: NEGATIVE
Ketones, ur: NEGATIVE
Leukocytes,Ua: NEGATIVE
Nitrite: NEGATIVE
Protein, ur: NEGATIVE
Specific Gravity, Urine: 1.006 (ref 1.001–1.03)
pH: 7 (ref 5.0–8.0)

## 2019-10-01 LAB — LIPID PANEL
Cholesterol: 187 mg/dL (ref <200)
HDL: 72 mg/dL (ref >=50)
LDL Cholesterol (Calc): 98 mg/dL (calc)
Non-HDL Cholesterol (Calc): 115 mg/dL (calc) (ref <130)
Total CHOL/HDL Ratio: 2.6 (calc) (ref <5.0)
Triglycerides: 83 mg/dL (ref <150)

## 2019-10-01 LAB — MICROALBUMIN / CREATININE URINE RATIO
Creatinine, Urine: 15 mg/dL — ABNORMAL LOW (ref 20–275)
Microalb, Ur: <0.2 mg/dL

## 2019-10-01 LAB — TSH: TSH: 1.19 mIU/L (ref 0.40–4.50)

## 2019-10-13 ENCOUNTER — Ambulatory Visit: Payer: Self-pay

## 2019-10-13 ENCOUNTER — Ambulatory Visit: Payer: 59 | Admitting: Orthopedic Surgery

## 2019-10-13 ENCOUNTER — Other Ambulatory Visit: Payer: Self-pay

## 2019-10-13 ENCOUNTER — Encounter: Payer: Self-pay | Admitting: Orthopedic Surgery

## 2019-10-13 DIAGNOSIS — M25512 Pain in left shoulder: Secondary | ICD-10-CM

## 2019-10-13 DIAGNOSIS — M25561 Pain in right knee: Secondary | ICD-10-CM | POA: Diagnosis not present

## 2019-10-13 NOTE — Progress Notes (Signed)
Office Visit Note   Patient: Carla Morton           Date of Birth: [birth year]           MRN: [id number] Visit Date: 10/13/2019 Requested by: Vicie Mutters, PA-C [address] [place],   [id number] PCP: Unk Pinto, MD  Subjective: Chief Complaint  Patient presents with  . Left Shoulder - Pain  . Right Knee - Pain    HPI: Carla Morton is a patient with mild left shoulder and right knee pain.  The left shoulder is been hurting her for a year.  She had some overuse injury working with a horse about a year ago.  She states that she has had very mild diminished range of motion.  Unable to reach behind her head.  She is right-hand dominant.  She does report some occasional pain radiating down into the elbow region but denies any numbness and tingling.  Patient also describes right knee pain.'s been going on for 3 weeks.  She twisted the knee and felt a pop.  Denied any swelling.  Hard to stand after that but in general has been getting better.  When she wakes up she has no pain.  She does use a sleeve.  She works as an Clinical biochemist.  No prior injury or surgery to the right knee.  Her pain is better now and is livable.  She describes deltoid insertion pain in the left shoulder.              ROS: All systems reviewed are negative as they relate to the chief complaint within the history of present illness.  Patient denies  fevers or chills.   Assessment & Plan: Visit Diagnoses:  1. Left shoulder pain, unspecified chronicity   2. Right knee pain, unspecified chronicity     Plan: Impression is left shoulder pain with slight restriction of external rotation of 15 degrees of abduction and with forward flexion.  No real degenerative changes noted.  Rotator cuff strength is good.  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  I think that injection would be the first step once she gets a little bit more symptomatic.  That would be a glenohumeral  joint injection.  She is not really there yet for that intervention.  I do want her to continue to work on shoulder range of motion exercises.  Regarding the knee I think it is possible she may have a degenerative meniscal tear but no focal joint line tenderness and no effusion would argue against continued observation especially in light of continued clinical improvement.  Neck step for the knee would be injection and scanning.  Follow-Up Instructions: Return if symptoms worsen or fail to improve.   Orders:  Orders Placed This Encounter  Procedures  . XR Shoulder Left  . XR KNEE 3 VIEW RIGHT   No orders of the defined types were placed in this encounter.     Procedures: No procedures performed   Clinical Data: No additional findings.  Objective: Vital Signs: There were no vitals taken for this visit.  Physical Exam:   Constitutional: Patient appears well-developed HEENT:  Head: Normocephalic Eyes:EOM are normal Neck: Normal range of motion Cardiovascular: Normal rate Pulmonary/chest: Effort normal Neurologic: Patient is alert Skin: Skin is warm Psychiatric: Patient has normal mood and affect    Ortho Exam: Ortho exam demonstrates on the right knee full range of motion no effusion stable collateral and cruciate  ligaments no focal joint line tenderness.  Range of motion is full.  Negative McMurray compression testing. Examination of the left shoulder demonstrates about 10 degrees less forward flexion of the left compared to the right about 10 degrees less external rotation of 15 degrees of abduction on the left of 80 compared to 90 on the right.  Rotator cuff strength is excellent infraspinatus supraspinatus and subscap muscle testing.  Specialty Comments:  No specialty comments available.  Imaging: XR KNEE 3 VIEW RIGHT  Result Date: 10/13/2019 AP lateral merchant right knee reviewed.  Slight varus alignment present.  Mild spurring in the medial joint space is present.   No acute fracture or dislocation.  Patella height normal in relation to the femur.  XR Shoulder Left  Result Date: 10/13/2019 AP axillary outlet left shoulder reviewed.  No fracture or dislocation is present.  Acromiohumeral distance maintained.  Mild AC joint degenerative changes.  No glenohumeral joint changes or arthritis.    PMFS History: Patient Active Problem List   Diagnosis Date Noted  . DDD (degenerative disc disease), cervical 09/26/2018  . Deflected nasal septum 09/13/2015  . Allergic rhinitis, seasonal 09/13/2015  . Hyperlipidemia   . Prediabetes   . Vitamin D deficiency    Past Medical History:  Diagnosis Date  . Allergy   . Arthritis   . Asthma    as a child  . Diabetes (HCC)    pre-diabetic  . Diverticulosis   . GERD (gastroesophageal reflux disease)   . Hemorrhoids   . Hyperlipidemia   . Hypotension   . Vitamin D deficiency     Family History  Problem Relation Age of Onset  . Other Father        colon resection  . Cancer Father 55       colon  . Dementia Father   . Colon cancer Maternal Grandmother   . Arthritis Sister   . Cancer Brother 68       colon  . Cirrhosis Brother   . Arthritis Sister     Past Surgical History:  Procedure Laterality Date  . CARPAL TUNNEL RELEASE Bilateral   . COLONOSCOPY  07/2011   Due 2017, Hem and tics, Dr. Tanna Savoy  . NASAL SEPTUM SURGERY     Social History   Occupational History  . Occupation: Personnel officer  Tobacco Use  . Smoking status: Former Smoker    Packs/day: 1.00    Years: 8.00    Pack years: 8.00    Types: Cigarettes    Quit date: 08/01/1975    Years since quitting: 44.2  . Smokeless tobacco: Never Used  Substance and Sexual Activity  . Alcohol use: Yes    Comment: Occasional on weekend  . Drug use: Yes    Types: Marijuana  . Sexual activity: Yes

## 2020-02-17 LAB — HM MAMMOGRAPHY

## 2020-03-15 LAB — HM DEXA SCAN

## 2020-04-28 ENCOUNTER — Telehealth: Payer: Self-pay | Admitting: Physician Assistant

## 2020-04-28 NOTE — Telephone Encounter (Signed)
patient called to get more information regarding referrals. returned call, lvm for patient to please call me for further details

## 2020-04-30 ENCOUNTER — Telehealth: Payer: Self-pay | Admitting: Physician Assistant

## 2020-04-30 NOTE — Telephone Encounter (Signed)
Patient called to request if she can change facility that does her DEXA and Mammogram, current done at [facility]gyn, [facility]. She has concerns regarding DEXA outcome done at [facility] 2021. She states it showed her Vit D is low and GYN recommended 1000IU Vit D. She states she takes 5000IU daily, and not sure the DEXA is accurate. She states, There was an issue with the A/C which prevented some the equipment from working. She did not do her mammogram- the machine could not operate properly in the office conditions. I advised patient  that we did not have a copy of the 2021  DEXA, but will request and review the report and advise patient. Release of information to be faxed after signature obtained.   She has requested that future mammograms and DEXA be done at [facility].

## 2020-06-01 ENCOUNTER — Encounter: Payer: Self-pay | Admitting: Internal Medicine

## 2020-08-25 ENCOUNTER — Other Ambulatory Visit: Payer: Self-pay

## 2020-08-25 ENCOUNTER — Ambulatory Visit: Payer: 59 | Admitting: Adult Health

## 2020-08-25 ENCOUNTER — Encounter: Payer: Self-pay | Admitting: Adult Health

## 2020-08-25 VITALS — HR 76 | Temp 97.9°F

## 2020-08-25 DIAGNOSIS — Z1152 Encounter for screening for COVID-19: Secondary | ICD-10-CM

## 2020-08-25 DIAGNOSIS — Z20822 Contact with and (suspected) exposure to covid-19: Secondary | ICD-10-CM

## 2020-08-25 DIAGNOSIS — U071 COVID-19: Secondary | ICD-10-CM

## 2020-08-25 DIAGNOSIS — J4520 Mild intermittent asthma, uncomplicated: Secondary | ICD-10-CM

## 2020-08-25 HISTORY — DX: COVID-19: U07.1

## 2020-08-25 LAB — POC COVID19 BINAXNOW: SARS Coronavirus 2 Ag: NEGATIVE

## 2020-08-25 MED ORDER — ALBUTEROL SULFATE HFA 108 (90 BASE) MCG/ACT IN AERS: 1.0000 | INHALATION_SPRAY | RESPIRATORY_TRACT | 0 refills | Status: DC | PRN

## 2020-08-25 MED ORDER — PROMETHAZINE-DM 6.25-15 MG/5ML PO SYRP: 5.0000 mL | ORAL_SOLUTION | Freq: Four times a day (QID) | ORAL | 1 refills | Status: DC | PRN

## 2020-08-25 MED ORDER — DEXAMETHASONE 1 MG PO TABS: ORAL_TABLET | ORAL | 0 refills | Status: DC

## 2020-08-25 NOTE — Progress Notes (Signed)
THIS ENCOUNTER IS A VIRTUAL VISIT DUE TO COVID-19 - PATIENT WAS NOT SEEN IN THE OFFICE.  PATIENT HAS CONSENTED TO VIRTUAL VISIT / TELEMEDICINE VISIT   Virtual Visit via telephone Note  I connected with Carla Morton on 08/25/2020 by telephone.  I verified that I am speaking with the correct person using two identifiers.    I discussed the limitations of evaluation and management by telemedicine and the availability of in person appointments. The patient expressed understanding and agreed to proceed.  History of Present Illness:  Pulse 76   Temp 97.9 F (36.6 C)   SpO2 95%   60 y.o. patient with hx of allergic rhinitis contacted office due to URI sx suspect for covid 19. They were found to be positive on rapid screen, OV was converted to telephone visit to minimize exposure in office. This patient had 1 pfizer vaccine in August 04/2020, never got second as started dramatically losing hair  vaccinated for covid 19.   Sx began 2-3 days days ago with temp 101-102 yesterday, generalized body aches, headache, very minimal cough. Has been taking baby aspirin and tylenol for fever and headache. Reports temp this AM was 101, improved here to 97.9. Feeling improved today. Does note cough is mildly progressive, starting to be productive, new rhinitis (clear).   Does has hx of asthma in childhood, prone to bronchitis, has noted some wheezing.  Denies CP, dyspnea.   Taking vit D 5000 IU, super B with C 500 mg, tumeric   Taking zyrtec and flonase daily  Doing saline sprays  Exposures: Boyfriend had cough last week, never got tested   Vaccination: 1 pfizer, had SE and didn't get second   Medications  Current Outpatient Medications (Endocrine & Metabolic):  .  estradiol-norethindrone (ACTIVELLA) 1-0.5 MG tablet, Take 1 tablet by mouth daily.   Current Outpatient Medications (Respiratory):  .  cetirizine (ZYRTEC) 10 MG tablet, Take 10 mg by mouth daily. .  fluticasone (FLONASE) 50 MCG/ACT nasal  spray, Place 2 sprays into both nostrils daily.    Current Outpatient Medications (Other):  Marland Kitchen  B Complex-C (SUPER B COMPLEX PO), Take 1 tablet by mouth daily. .  Cholecalciferol (VITAMIN D PO), Take 5,000 Units by mouth daily. .  Homeopathic Products (HYLAFEM) SUPP, Place 1 applicator vaginally 2 (two) times a week. .  Multiple Vitamins-Minerals (CENTRUM SILVER PO), Take 1 tablet by mouth daily. .  Probiotic Product (PROBIOTIC DAILY PO), Take by mouth daily. Digestive Advantage .  ranitidine (ZANTAC) 150 MG tablet, Take 150 mg by mouth. .  TURMERIC PO, Take 1,000 mg by mouth daily.  Allergies:  Allergies  Allergen Reactions  . Montelukast     Other reaction(s): Other (See Comments)  . Singulair [Montelukast Sodium]   . Erythromycin Rash    Problem list She has Hyperlipidemia; Prediabetes; Vitamin D deficiency; Deflected nasal septum; Allergic rhinitis, seasonal; and DDD (degenerative disc disease), cervical on their problem list.   Social History:   reports that she quit smoking about 45 years ago. Her smoking use included cigarettes. She has a 8.00 pack-year smoking history. She has never used smokeless tobacco. She reports current alcohol use. She reports current drug use. Drug: Marijuana.   Observations/Objective:  General : Well sounding patient in no apparent distress HEENT: no hoarseness, no cough for duration of visit Lungs: speaks in complete sentences, no audible wheezing, no apparent distress Neurological: alert, oriented x 3 Psychiatric: pleasant, judgement appropriate   Assessment and Plan:  COVID-19  Covid 19  positive per rapid screening test in partially vaccinated Currently symptoms are mild Risk factors include asthma Regular breathing exercises, proning EC bASA daily for clot prevention unless contraindicated, regular walking/calf exercises Take tylenol PRN temp 101+ Push hydration Sx supportive therapy Steroid taper was offered  - dexamethasone sent  in  Discussed referral for oral antiviral - declines for now Immune support with vitamin C, zinc, vitamin D reviewed Follow up via mychart or telephone if needed Advised patient obtain O2 monitor; present to ED if persistently <88% or with severe dyspnea, any CP, fever uncontrolled by tylenol, confusion, sudden decline Should remain in isolation until at least 5 days from onset of sx, 24-48 hours fever free without tylenol, sx such as cough are improved. -     albuterol (VENTOLIN HFA) 108 (90 Base) MCG/ACT inhaler; Inhale 1-2 puffs into the lungs every 4 (four) hours as needed for wheezing or shortness of breath. Please give generic or the one that insurance covers -     dexamethasone (DECADRON) 1 MG tablet; Take 3 tabs for 3 days, 2 tabs for 3 days 1 tab for 5 days. Take with food. -     promethazine-dextromethorphan (PROMETHAZINE-DM) 6.25-15 MG/5ML syrup; Take 5 mLs by mouth 4 (four) times daily as needed for cough. -     POC COVID-19 BinaxNow     Follow Up Instructions:  I discussed the assessment and treatment plan with the patient. The patient was provided an opportunity to ask questions and all were answered. The patient agreed with the plan and demonstrated an understanding of the instructions.   The patient was advised to call back or seek an in-person evaluation if the symptoms worsen or if the condition fails to improve as anticipated.  I provided 20 minutes of non-face-to-face time during this encounter.   Dan Maker, NP   Future Appointments  Date Time Provider Department Center  09/29/2020  9:00 AM Elder Negus, NP [facility] None

## 2020-08-30 ENCOUNTER — Other Ambulatory Visit: Payer: Self-pay | Admitting: Adult Health

## 2020-08-30 ENCOUNTER — Telehealth: Payer: Self-pay

## 2020-08-30 MED ORDER — PREDNISONE 20 MG PO TABS: ORAL_TABLET | ORAL | 0 refills | Status: DC

## 2020-08-30 NOTE — Telephone Encounter (Signed)
Patient states that she is very jittery taking the Dexamethasone. Cannot take it the way it is prescribed. Has 8 tablets left.  Has taken Prednisone in the past without any side effects. Is this something that can be changed? Please advise.

## 2020-09-01 ENCOUNTER — Other Ambulatory Visit: Payer: Self-pay | Admitting: Internal Medicine

## 2020-09-01 DIAGNOSIS — Z1231 Encounter for screening mammogram for malignant neoplasm of breast: Secondary | ICD-10-CM

## 2020-09-29 ENCOUNTER — Encounter: Payer: BLUE CROSS/BLUE SHIELD | Admitting: Adult Health Nurse Practitioner

## 2020-10-11 NOTE — Progress Notes (Signed)
Complete Physical  Assessment and Plan:  Encounter for general adult medical examination with abnormal findings 1 year Will get MGM/DEXA/and PAP report   Hyperlipidemia, unspecified hyperlipidemia type Recently well controlled by lifestyle  decrease fatty foods increase activity. -     TSH -     Lipid panel  Abnormal glucose -     TSH Discussed disease progression and risks Discussed diet/exercise, weight management and risk modification  Vitamin D deficiency -     VITAMIN D 25 Hydroxy (Vit-D Deficiency, Fractures)  Medication management -     CBC with Differential/Platelet -     COMPLETE METABOLIC PANEL WITH GFR -     Magnesium  Screening for blood or protein in urine -     Urinalysis, Routine w reflex microscopic  Deflected nasal septum Continue ENT prn  Seasonal allergic rhinitis, unspecified trigger Continue OTC meds  DDD (degenerative disc disease), cervical Monitor  COPD/hyperinflation - Follow up CXR  R knee meniscus tear Dr. August Saucer following, conservative interventions; monitor  Orders Placed This Encounter  Procedures   DG Chest 2 View   CBC with Differential/Platelet   COMPLETE METABOLIC PANEL WITH GFR   Magnesium   Lipid panel   TSH   Hemoglobin A1c   VITAMIN D 25 Hydroxy (Vit-D Deficiency, Fractures)   Urinalysis, Routine w reflex microscopic   EKG 12-Lead    Discussed med's effects and SE's. Screening labs and tests as requested with regular follow-up as recommended. Future Appointments  Date Time Provider Department Center  10/18/2020  2:20 PM [facility]  10/13/2021 10:00 AM Judd Gaudier, NP [facility] None    HPI 60 y.o. female  presents for a complete physical. She has Hyperlipidemia; Other abnormal glucose (hx of prediabetes) ; Vitamin D deficiency; Deflected nasal septum; Allergic rhinitis, seasonal; DDD (degenerative disc disease), cervical; and COVID-19 (08/23/2020) on their problem list.  She is  divorced. She has steady boyfriend, Loraine Leriche, have been together for 16 years. No kids, works as Personnel officer.    She recently in 07/2020 had covid 19, reports has recovered without residual symptoms.    She had some grief last year r/t numerous deaths in family, brother (cirrhosis), sister's fiance was murdered, father passed (dementia, fall). Did do some Grief counseling, boyfriend is supportive, she feels doing better.   She is having hot flashes at night, on activella, [facility]  She uses boric acid occ for yeast.   She follows with Dr. August Saucer, was having left shoulder stiffness that resolved, also follows for mild R knee meniscus tear, conservative interventions only. Wears a sleeve at work.   BMI is Body mass index is 21.6 kg/m., she is working on diet and exercise, works on elliptical daily, also fairly active job.  Wt Readings from Last 3 Encounters:  10/13/20 140 lb (63.5 kg)  09/30/19 138 lb 12.8 oz (63 kg)  09/26/18 139 lb (63 kg)   Her blood pressure has been controlled at home, today their BP is BP: 110/70  She does workout. She denies chest pain, shortness of breath, dizziness.  Her cholesterol is diet controlled.  Her cholesterol is at goal. The cholesterol last visit was:  Lab Results  Component Value Date   CHOL 187 09/30/2019   HDL 72 09/30/2019   LDLCALC 98 09/30/2019   TRIG 83 09/30/2019   CHOLHDL 2.6 09/30/2019   She has been working on diet and exercise for hx of mild intermittent  prediabetes, and denies blurry vision, polydipsia, polyphagia and polyuria. Last A1C in the office was:  Lab Results  Component Value Date   HGBA1C 5.3 09/25/2016   Patient is on Vitamin D supplement, 5000 IU daily  Lab Results  Component Value Date   VD25OH 74 09/30/2019     Current Medications:  Current Outpatient Medications on File Prior to Visit  Medication Sig Dispense Refill   albuterol (VENTOLIN HFA) 108 (90 Base) MCG/ACT inhaler  Inhale 1-2 puffs into the lungs every 4 (four) hours as needed for wheezing or shortness of breath. Please give generic or the one that insurance covers 1 each 0   B Complex-C (SUPER B COMPLEX PO) Take 1 tablet by mouth daily.     cetirizine (ZYRTEC) 10 MG tablet Take 10 mg by mouth daily.     Cholecalciferol (VITAMIN D PO) Take 5,000 Units by mouth daily.     estradiol-norethindrone (ACTIVELLA) 1-0.5 MG tablet Take 1 tablet by mouth daily. 30 tablet 6   fluticasone (FLONASE) 50 MCG/ACT nasal spray Place 2 sprays into both nostrils daily.     Homeopathic Products (HYLAFEM) SUPP Place 1 applicator vaginally 2 (two) times a week. 21 suppository 3   Multiple Vitamins-Minerals (CENTRUM SILVER PO) Take 1 tablet by mouth daily.     TURMERIC PO Take 1,000 mg by mouth daily.     No current facility-administered medications on file prior to visit.   Health Maintenance:  Immunization History  Administered Date(s) Administered   Influenza Inj Mdck Quad Pf 06/09/2017, 05/09/2019   Influenza Split 06/09/2014   Influenza,inj,Quad PF,6+ Mos 05/17/2015, 05/17/2018   Influenza-Unspecified 05/26/2013, 05/17/2018   PFIZER Comirnaty(Gray Top)Covid-19 Tri-Sucrose Vaccine 03/09/2020   Pneumococcal Conjugate-13 06/09/2014   Pneumococcal-Unspecified 08/06/2010   Tdap 12/21/2011   Tetanus: 2013 Pneumovax: 2012 Prevnar 13: 2015- had a reaction Flu vaccine: 2020, out in office  Shingrix: check with insurance  Covid 19: 03/09/2020, declines further, fatigue x 3 weeks  Pap: June 2020 Dr. Vickey Sages, pelvic yearly MGM: June 2020 at GYN office, has scheduled Monday DEXA: 12/2017- normal  Colonoscopy: 11/2017 Dr. Tanna Sav[facility]- due 5 years EGD: 2000 with Dr. Marina Goodell, + [facility] (Dr. Luetta Nutting) DEE 3 years in [place]  Last eye: BIL is optician, sees annually, last 06/2020, glasses Last dental: Yesterday Last derm: [facility], goes intermittently, last 2019, will call to schedule    Medical History:  Past Medical History:  Diagnosis Date   Allergy    Arthritis    Asthma    as a child   Diabetes (HCC)    pre-diabetic   Diverticulosis    GERD (gastroesophageal reflux disease)    Hemorrhoids    Hyperlipidemia    Hypotension    Vitamin D deficiency    Allergies Allergies  Allergen Reactions   Dexamethasone     Severe jitteriness, metallic taste in mouth   Montelukast     Other reaction(s): Other (See Comments)   Singulair [Montelukast Sodium]    Erythromycin Rash    SURGICAL HISTORY She  has a past surgical history that includes Nasal septum surgery; Carpal tunnel release (Bilateral); and Colonoscopy (07/2011). FAMILY HISTORY Her family history includes Arthritis in her sister and sister; Cancer (age of onset: 87) in her brother; Cancer (age of onset: 79) in her father; Cirrhosis in her brother; Colon cancer in her maternal grandmother; Dementia in her father, maternal grandfather, and maternal grandmother; Other in her father. SOCIAL HISTORY She  reports that she quit smoking about  45 years ago. Her smoking use included cigarettes. She has a 8.00 pack-year smoking history. She has never used smokeless tobacco. She reports current alcohol use. She reports previous drug use. Drug: Marijuana.  ROS Review of Systems  Constitutional: Negative for malaise/fatigue and weight loss.  HENT: Negative for hearing loss and tinnitus.   Eyes: Negative for blurred vision and double vision.  Respiratory: Negative for cough, shortness of breath and wheezing.   Cardiovascular: Negative for chest pain, palpitations, orthopnea, claudication and leg swelling.  Gastrointestinal: Negative for abdominal pain, blood in stool, constipation, diarrhea, heartburn, melena, nausea and vomiting.  Genitourinary: Negative.   Musculoskeletal: Negative for joint pain and myalgias.  Skin: Negative for rash.  Neurological: Negative for dizziness, tingling, sensory change,  weakness and headaches.  Endo/Heme/Allergies: Negative for polydipsia.  Psychiatric/Behavioral: Negative.   All other systems reviewed and are negative.   Physical Exam: Estimated body mass index is 21.6 kg/m as calculated from the following:   Height as of this encounter: 5' 7.5" (1.715 m).   Weight as of this encounter: 140 lb (63.5 kg). Vitals:   10/13/20 1004  BP: 110/70  Pulse: 90  Temp: 97.7 F (36.5 C)  SpO2: 99%   General Appearance: Well nourished, in no apparent distress. Eyes: PERRLA, EOMs, conjunctiva no swelling or erythema  Sinuses: No Frontal/maxillary tenderness ENT/Mouth: Ext aud canals clear, normal light reflex with TMs without erythema, bulging.  Good dentition. No erythema, swelling, or exudate on post pharynx. Tonsils not swollen or erythematous. Hearing normal.  Neck: Supple, thyroid normal. No bruits Respiratory: Respiratory effort normal, BS equal bilaterally without rales, rhonchi, wheezing or stridor. Cardio: RRR without murmurs, rubs or gallops. Brisk peripheral pulses without edema.  Chest: symmetric, with normal excursions and percussion. Breasts: defer to GYN Abdomen: Soft, +BS. Non tender, no guarding, rebound, hernias, masses, or organomegaly. .  Lymphatics: Non tender without lymphadenopathy.  Genitourinary: defer to GYN Musculoskeletal: Full ROM all peripheral extremities,5/5 strength, and normal gait. No laxity right knee, clicking/popping.  Skin: Warm, dry without rashes, lesions, ecchymosis. Neuro: Cranial nerves intact, reflexes equal bilaterally. Normal muscle tone, no cerebellar symptoms. Sensation intact.  Psych: Awake and oriented X 3, normal affect, Insight and Judgment appropriate.   EKG: NSR, IRBBB   Carlyon Shadow Donnajean Chesnut 12:53 PM

## 2020-10-13 ENCOUNTER — Other Ambulatory Visit: Payer: Self-pay

## 2020-10-13 ENCOUNTER — Encounter: Payer: Self-pay | Admitting: Adult Health

## 2020-10-13 ENCOUNTER — Ambulatory Visit (INDEPENDENT_AMBULATORY_CARE_PROVIDER_SITE_OTHER): Payer: 59 | Admitting: Adult Health

## 2020-10-13 ENCOUNTER — Ambulatory Visit
Admission: RE | Admit: 2020-10-13 | Discharge: 2020-10-13 | Disposition: A | Discharge status: Home / Self Care | Payer: BLUE CROSS/BLUE SHIELD | Source: Ambulatory Visit | Attending: Adult Health | Admitting: Adult Health

## 2020-10-13 VITALS — BP 110/70 | HR 90 | Temp 97.7°F | Ht 67.5 in | Wt 140.0 lb

## 2020-10-13 DIAGNOSIS — Z131 Encounter for screening for diabetes mellitus: Secondary | ICD-10-CM | POA: Diagnosis not present

## 2020-10-13 DIAGNOSIS — J302 Other seasonal allergic rhinitis: Secondary | ICD-10-CM

## 2020-10-13 DIAGNOSIS — Z13220 Encounter for screening for lipoid disorders: Secondary | ICD-10-CM

## 2020-10-13 DIAGNOSIS — J449 Chronic obstructive pulmonary disease, unspecified: Secondary | ICD-10-CM

## 2020-10-13 DIAGNOSIS — R7309 Other abnormal glucose: Secondary | ICD-10-CM

## 2020-10-13 DIAGNOSIS — S83206S Unspecified tear of unspecified meniscus, current injury, right knee, sequela: Secondary | ICD-10-CM

## 2020-10-13 DIAGNOSIS — M5030 Other cervical disc degeneration, unspecified cervical region: Secondary | ICD-10-CM

## 2020-10-13 DIAGNOSIS — E559 Vitamin D deficiency, unspecified: Secondary | ICD-10-CM | POA: Diagnosis not present

## 2020-10-13 DIAGNOSIS — Z1329 Encounter for screening for other suspected endocrine disorder: Secondary | ICD-10-CM

## 2020-10-13 DIAGNOSIS — R0789 Other chest pain: Secondary | ICD-10-CM | POA: Diagnosis not present

## 2020-10-13 DIAGNOSIS — Z1389 Encounter for screening for other disorder: Secondary | ICD-10-CM

## 2020-10-13 DIAGNOSIS — Z79899 Other long term (current) drug therapy: Secondary | ICD-10-CM | POA: Diagnosis not present

## 2020-10-13 DIAGNOSIS — Z0000 Encounter for general adult medical examination without abnormal findings: Secondary | ICD-10-CM | POA: Diagnosis not present

## 2020-10-13 DIAGNOSIS — Z6821 Body mass index (BMI) 21.0-21.9, adult: Secondary | ICD-10-CM

## 2020-10-13 DIAGNOSIS — Z136 Encounter for screening for cardiovascular disorders: Secondary | ICD-10-CM

## 2020-10-13 DIAGNOSIS — R030 Elevated blood-pressure reading, without diagnosis of hypertension: Secondary | ICD-10-CM

## 2020-10-13 DIAGNOSIS — E785 Hyperlipidemia, unspecified: Secondary | ICD-10-CM

## 2020-10-13 NOTE — Patient Instructions (Addendum)
Carla Morton , Thank you for taking time to come for your Annual Wellness Visit. I appreciate your ongoing commitment to your health goals. Please review the following plan we discussed and let me know if I can assist you in the future.   This is a list of the screening recommended for you and due dates:  Health Maintenance  Topic Date Due  . Flu Shot  10/14/2020*  . COVID-19 Vaccine (2 - Pfizer 3-dose series) 10/29/2020*  . Pap Smear  01/10/2021  . Tetanus Vaccine  12/20/2021  . Mammogram  02/16/2022  . Colon Cancer Screening  12/22/2022  .  Hepatitis C: One time screening is recommended by Center for Disease Control  (CDC) for  adults born from [birth year] through 1966.   Completed  . HIV Screening  Completed  . HPV Vaccine  Aged Out  *Topic was postponed. The date shown is not the original due date.     Check with insurance if they cover shingrix -     Know what a healthy weight is for you (roughly BMI <25) and aim to maintain this  Aim for 7+ servings of fruits and vegetables daily  65-80+ fluid ounces of water or unsweet tea for healthy kidneys  Limit to max 1 drink of alcohol per day; avoid smoking/tobacco  Limit animal fats in diet for cholesterol and heart health - choose grass fed whenever available  Avoid highly processed foods, and foods high in saturated/trans fats  Aim for low stress - take time to unwind and care for your mental health  Aim for 150 min of moderate intensity exercise weekly for heart health, and weights twice weekly for bone health  Aim for 7-9 hours of sleep daily    Zoster Vaccine, Recombinant injection What is this medicine? ZOSTER VACCINE (ZOS ter vak SEEN) is a vaccine used to reduce the risk of getting shingles. This vaccine is not used to treat shingles or nerve pain from shingles. This medicine may be used for other purposes; ask your health care provider or pharmacist if you have questions. COMMON BRAND NAME(S): [employer] [facility] [employer] What should I tell  my health care provider before I take this medicine? They need to know if you have any of these conditions:  cancer  immune system problems  an unusual or allergic reaction to Zoster vaccine, other medications, foods, dyes, or preservatives  pregnant or trying to get pregnant  breast-feeding How should I use this medicine? This vaccine is injected into a muscle. It is given by a health care provider. A copy of Vaccine Information Statements will be given before each vaccination. Be sure to read this information carefully each time. This sheet may change often. Talk to your health care provider about the use of this vaccine in children. This vaccine is not approved for use in children. Overdosage: If you think you have taken too much of this medicine contact a poison control center or emergency room at once. NOTE: This medicine is only for you. Do not share this medicine with others. What if I miss a dose? Keep appointments for follow-up (booster) doses. It is important not to miss your dose. Call your health care provider if you are unable to keep an appointment. What may interact with this medicine?  medicines that suppress your immune system  medicines to treat cancer  steroid medicines like prednisone or cortisone This list may not describe all possible interactions. Give your health care provider a list of all the medicines, herbs,  non-prescription drugs, or dietary supplements you use. Also tell them if you smoke, drink alcohol, or use illegal drugs. Some items may interact with your medicine. What should I watch for while using this medicine? Visit your health care provider regularly. This vaccine, like all vaccines, may not fully protect everyone. What side effects may I notice from receiving this medicine? Side effects that you should report to your doctor or health care professional as soon as possible:  allergic reactions (skin rash, itching or hives; swelling of the face,  lips, or tongue)  trouble breathing Side effects that usually do not require medical attention (report these to your doctor or health care professional if they continue or are bothersome):  chills  headache  fever  nausea  pain, redness, or irritation at site where injected  tiredness  vomiting This list may not describe all possible side effects. Call your doctor for medical advice about side effects. You may report side effects to FDA at 1-800-FDA-1088. Where should I keep my medicine? This vaccine is only given by a health care provider. It will not be stored at home. NOTE: This sheet is a summary. It may not cover all possible information. If you have questions about this medicine, talk to your doctor, pharmacist, or health care provider.  2021 Elsevier/Gold Standard (2019-08-22 16:23:07)

## 2020-10-14 LAB — CBC WITH DIFFERENTIAL/PLATELET
Absolute Monocytes: 514 cells/uL (ref 200–950)
Basophils Absolute: 38 cells/uL (ref 0–200)
Basophils Relative: 0.8 %
Eosinophils Absolute: 91 cells/uL (ref 15–500)
Eosinophils Relative: 1.9 %
HCT: 39.3 % (ref 35.0–45.0)
Hemoglobin: 13.1 g/dL (ref 11.7–15.5)
Lymphs Abs: 1704 cells/uL (ref 850–3900)
MCH: 31.6 pg (ref 27.0–33.0)
MCHC: 33.3 g/dL (ref 32.0–36.0)
MCV: 94.7 fL (ref 80.0–100.0)
MPV: 9.6 fL (ref 7.5–12.5)
Monocytes Relative: 10.7 %
Neutro Abs: 2453 cells/uL (ref 1500–7800)
Neutrophils Relative %: 51.1 %
Platelets: 250 10*3/uL (ref 140–400)
RBC: 4.15 10*6/uL (ref 3.80–5.10)
RDW: 12.1 % (ref 11.0–15.0)
Total Lymphocyte: 35.5 %
WBC: 4.8 10*3/uL (ref 3.8–10.8)

## 2020-10-14 LAB — LIPID PANEL
Cholesterol: 233 mg/dL — ABNORMAL HIGH (ref <200)
HDL: 83 mg/dL (ref >=50)
LDL Cholesterol (Calc): 131 mg/dL (calc) — ABNORMAL HIGH
Non-HDL Cholesterol (Calc): 150 mg/dL (calc) — ABNORMAL HIGH (ref <130)
Total CHOL/HDL Ratio: 2.8 (calc) (ref <5.0)
Triglycerides: 93 mg/dL (ref <150)

## 2020-10-14 LAB — COMPLETE METABOLIC PANEL WITH GFR
AG Ratio: 2 (calc) (ref 1.0–2.5)
ALT: 24 U/L (ref 6–29)
AST: 29 U/L (ref 10–35)
Albumin: 4.8 g/dL (ref 3.6–5.1)
Alkaline phosphatase (APISO): 50 U/L (ref 37–153)
BUN: 15 mg/dL (ref 7–25)
CO2: 30 mmol/L (ref 20–32)
Calcium: 10 mg/dL (ref 8.6–10.4)
Chloride: 102 mmol/L (ref 98–110)
Creat: 0.63 mg/dL (ref 0.50–0.99)
GFR, Est African American: 113 mL/min/{1.73_m2} (ref >=60)
GFR, Est Non African American: 97 mL/min/{1.73_m2} (ref >=60)
Globulin: 2.4 g/dL (calc) (ref 1.9–3.7)
Glucose, Bld: 84 mg/dL (ref 65–99)
Potassium: 4.4 mmol/L (ref 3.5–5.3)
Sodium: 140 mmol/L (ref 135–146)
Total Bilirubin: 0.6 mg/dL (ref 0.2–1.2)
Total Protein: 7.2 g/dL (ref 6.1–8.1)

## 2020-10-14 LAB — URINALYSIS, ROUTINE W REFLEX MICROSCOPIC
Bilirubin Urine: NEGATIVE
Glucose, UA: NEGATIVE
Hgb urine dipstick: NEGATIVE
Ketones, ur: NEGATIVE
Leukocytes,Ua: NEGATIVE
Nitrite: NEGATIVE
Protein, ur: NEGATIVE
Specific Gravity, Urine: 1.007 (ref 1.001–1.03)
pH: 6.5 (ref 5.0–8.0)

## 2020-10-14 LAB — HEMOGLOBIN A1C
Hgb A1c MFr Bld: 5.6 % of total Hgb (ref <5.7)
Mean Plasma Glucose: 114 mg/dL
eAG (mmol/L): 6.3 mmol/L

## 2020-10-14 LAB — MAGNESIUM: Magnesium: 2.2 mg/dL (ref 1.5–2.5)

## 2020-10-14 LAB — TSH: TSH: 1.01 mIU/L (ref 0.40–4.50)

## 2020-10-14 LAB — VITAMIN D 25 HYDROXY (VIT D DEFICIENCY, FRACTURES): Vit D, 25-Hydroxy: 68 ng/mL (ref 30–100)

## 2020-10-18 ENCOUNTER — Ambulatory Visit
Admission: RE | Admit: 2020-10-18 | Discharge: 2020-10-18 | Disposition: A | Discharge status: Home / Self Care | Payer: 59 | Source: Ambulatory Visit | Attending: Internal Medicine | Admitting: Internal Medicine

## 2020-10-18 ENCOUNTER — Other Ambulatory Visit: Payer: Self-pay

## 2020-10-18 DIAGNOSIS — Z1231 Encounter for screening mammogram for malignant neoplasm of breast: Secondary | ICD-10-CM

## 2020-10-20 ENCOUNTER — Other Ambulatory Visit: Payer: Self-pay | Admitting: Internal Medicine

## 2020-10-20 DIAGNOSIS — R928 Other abnormal and inconclusive findings on diagnostic imaging of breast: Secondary | ICD-10-CM

## 2020-11-11 ENCOUNTER — Other Ambulatory Visit: Payer: Self-pay

## 2020-11-11 ENCOUNTER — Other Ambulatory Visit: Payer: Self-pay | Admitting: Internal Medicine

## 2020-11-11 ENCOUNTER — Ambulatory Visit
Admission: RE | Admit: 2020-11-11 | Discharge: 2020-11-11 | Disposition: A | Discharge status: Home / Self Care | Payer: 59 | Source: Ambulatory Visit | Attending: Internal Medicine | Admitting: Internal Medicine

## 2020-11-11 DIAGNOSIS — R928 Other abnormal and inconclusive findings on diagnostic imaging of breast: Secondary | ICD-10-CM

## 2020-12-26 ENCOUNTER — Other Ambulatory Visit: Payer: Self-pay | Admitting: Adult Health

## 2020-12-26 DIAGNOSIS — U071 COVID-19: Secondary | ICD-10-CM

## 2020-12-26 DIAGNOSIS — J4520 Mild intermittent asthma, uncomplicated: Secondary | ICD-10-CM

## 2021-05-16 ENCOUNTER — Other Ambulatory Visit: Payer: 59

## 2021-08-23 DIAGNOSIS — D2272 Melanocytic nevi of left lower limb, including hip: Secondary | ICD-10-CM | POA: Diagnosis not present

## 2021-08-23 DIAGNOSIS — D2262 Melanocytic nevi of left upper limb, including shoulder: Secondary | ICD-10-CM | POA: Diagnosis not present

## 2021-08-23 DIAGNOSIS — D485 Neoplasm of uncertain behavior of skin: Secondary | ICD-10-CM | POA: Diagnosis not present

## 2021-08-23 DIAGNOSIS — D2239 Melanocytic nevi of other parts of face: Secondary | ICD-10-CM | POA: Diagnosis not present

## 2021-08-23 DIAGNOSIS — L821 Other seborrheic keratosis: Secondary | ICD-10-CM | POA: Diagnosis not present

## 2021-08-23 DIAGNOSIS — D2271 Melanocytic nevi of right lower limb, including hip: Secondary | ICD-10-CM | POA: Diagnosis not present

## 2021-08-23 DIAGNOSIS — Z85828 Personal history of other malignant neoplasm of skin: Secondary | ICD-10-CM | POA: Diagnosis not present

## 2021-08-23 DIAGNOSIS — L814 Other melanin hyperpigmentation: Secondary | ICD-10-CM | POA: Diagnosis not present

## 2021-08-23 DIAGNOSIS — L308 Other specified dermatitis: Secondary | ICD-10-CM | POA: Diagnosis not present

## 2021-08-23 DIAGNOSIS — D225 Melanocytic nevi of trunk: Secondary | ICD-10-CM | POA: Diagnosis not present

## 2021-09-14 ENCOUNTER — Other Ambulatory Visit: Payer: Self-pay

## 2021-09-14 ENCOUNTER — Encounter: Payer: Self-pay | Admitting: Nurse Practitioner

## 2021-09-14 ENCOUNTER — Ambulatory Visit: Payer: 59 | Admitting: Nurse Practitioner

## 2021-09-14 VITALS — BP 98/50 | HR 78 | Temp 97.9°F | Wt 141.4 lb

## 2021-09-14 DIAGNOSIS — Z1152 Encounter for screening for COVID-19: Secondary | ICD-10-CM

## 2021-09-14 DIAGNOSIS — U071 COVID-19: Secondary | ICD-10-CM | POA: Diagnosis not present

## 2021-09-14 DIAGNOSIS — J069 Acute upper respiratory infection, unspecified: Secondary | ICD-10-CM | POA: Diagnosis not present

## 2021-09-14 DIAGNOSIS — J4520 Mild intermittent asthma, uncomplicated: Secondary | ICD-10-CM

## 2021-09-14 LAB — POC COVID19 BINAXNOW: SARS Coronavirus 2 Ag: NEGATIVE

## 2021-09-14 MED ORDER — PREDNISONE 20 MG PO TABS: ORAL_TABLET | ORAL | 0 refills | Status: DC

## 2021-09-14 MED ORDER — ALBUTEROL SULFATE HFA 108 (90 BASE) MCG/ACT IN AERS: INHALATION_SPRAY | RESPIRATORY_TRACT | 3 refills | Status: AC

## 2021-09-14 MED ORDER — SULFAMETHOXAZOLE-TRIMETHOPRIM 400-80 MG PO TABS
1.0000 | ORAL_TABLET | Freq: Two times a day (BID) | ORAL | 0 refills | Status: AC
Start: 2021-09-14 — End: 2021-09-24

## 2021-09-14 NOTE — Progress Notes (Signed)
Assessment and Plan:   Carla Morton was seen today for acute visit.  Diagnoses and all orders for this visit:  Encounter for screening for COVID-19 -     POC COVID-19 Negative   Mild intermittent asthma, unspecified whether complicated Use regularly for the next 3-5 days Change from zyrtec to allegra or Claritin -     albuterol (VENTOLIN HFA) 108 (90 Base) MCG/ACT inhaler; Use 2 inhalations 15 minutes apart every 4 hours to rescue Asthma Attack  Acute URI Push fluids Sudafed as needed If symptoms have not improved, develops fever please notify the office -     predniSONE (DELTASONE) 20 MG tablet; 3 tablets daily with food for 3 days, 2 tabs daily for 3 days, 1 tab a day for 5 days. -     albuterol (VENTOLIN HFA) 108 (90 Base) MCG/ACT inhaler; Use 2 inhalations 15 minutes apart every 4 hours to rescue Asthma Attack -     sulfamethoxazole-trimethoprim (BACTRIM) 400-80 MG tablet; Take 1 tablet by mouth 2 (two) times daily for 10 days.       Further disposition pending results of labs. Discussed med's effects and SE's.   Over 30 minutes of exam, counseling, chart review, and critical decision making was performed.   Future Appointments  Date Time Provider [place]  10/13/2021 10:00 AM Liane Comber, NP [facility] None    ------------------------------------------------------------------------------------------------------------------   HPI BP (!) 98/50    Pulse 78    Temp 97.9 F (36.6 C)    Wt 141 lb 6.4 oz (64.1 kg)    SpO2 97%    BMI 21.82 kg/m   61 y.o.female presents for congestion, nonproductive cough, headache and sinus pressure, green nasal drainage.  Denies fever, nausea, vomiting, diarrhea. Has used Sudafed without much relief of symptoms.  She does have a history of bad seasonal allergies and some mild asthma.  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  Past Medical History:  Diagnosis Date    Allergy    Arthritis    Asthma    as a child   Diabetes ([place])    pre-diabetic   Diverticulosis    GERD (gastroesophageal reflux disease)    Hemorrhoids    Hyperlipidemia    Hypotension    Vitamin D deficiency      Allergies  Allergen Reactions   Dexamethasone     Severe jitteriness, metallic taste in mouth   Montelukast     Other reaction(s): Other (See Comments)   Singulair [Montelukast Sodium]    Erythromycin Rash    Current Outpatient Medications on File Prior to Visit  Medication Sig   aspirin 81 MG EC tablet Take 81 mg by mouth daily. Swallow whole.   B Complex-C (SUPER B COMPLEX PO) Take 1 tablet by mouth daily.   cetirizine (ZYRTEC) 10 MG tablet Take 10 mg by mouth daily.   Cholecalciferol (VITAMIN D PO) Take 5,000 Units by mouth daily.   estradiol-norethindrone (ACTIVELLA) 1-0.5 MG tablet Take 1 tablet by mouth daily.   fluticasone (FLONASE) 50 MCG/ACT nasal spray Place 2 sprays into both nostrils daily.   Multiple Vitamins-Minerals (CENTRUM SILVER PO) Take 1 tablet by mouth daily.   TURMERIC PO Take 1,000 mg by mouth daily.   albuterol (VENTOLIN HFA) 108 (90 Base) MCG/ACT inhaler Use 2 inhalations 15 minutes apart every 4 hours to rescue Asthma Attack (Patient not taking: Reported on 09/14/2021)   Homeopathic Products (HYLAFEM) SUPP Place  1 applicator vaginally 2 (two) times a week.   No current facility-administered medications on file prior to visit.    ROS: all negative except above.   Physical Exam:  BP (!) 98/50    Pulse 78    Temp 97.9 F (36.6 C)    Wt 141 lb 6.4 oz (64.1 kg)    SpO2 97%    BMI 21.82 kg/m   General Appearance: Well nourished, in no apparent distress. Eyes: PERRLA, EOMs, conjunctiva no swelling or erythema Sinuses: No Frontal/maxillary tenderness ENT/Mouth: Ext aud canals clear, TMs without erythema, bulging. No erythema, swelling, or exudate on post pharynx.  Tonsils not swollen or erythematous. Hearing normal.  Neck: Supple, thyroid  normal.  Respiratory: Respiratory effort normal, BS equal bilaterally without rales, rhonchi. Wheezing on inspiration is noted throughout lungs bilaterally  Cardio: RRR with no MRGs. Brisk peripheral pulses without edema.  Abdomen: Soft, + BS.  Non tender, no guarding, rebound, hernias, masses. Lymphatics: Positive cervical adenopathy bilateral that is tender. Musculoskeletal: Full ROM, 5/5 strength, normal gait.  Skin: Warm, dry without rashes, lesions, ecchymosis.  Neuro: Cranial nerves intact. Normal muscle tone, no cerebellar symptoms. Sensation intact.  Psych: Awake and oriented X 3, normal affect, Insight and Judgment appropriate.     Magda Bernheim, NP 2:11 PM [facility]

## 2021-09-18 ENCOUNTER — Other Ambulatory Visit: Payer: Self-pay | Admitting: Internal Medicine

## 2021-09-18 DIAGNOSIS — J069 Acute upper respiratory infection, unspecified: Secondary | ICD-10-CM

## 2021-09-18 MED ORDER — AZITHROMYCIN 250 MG PO TABS
ORAL_TABLET | ORAL | 1 refills | Status: DC
Start: 2021-09-18 — End: 2021-10-13

## 2021-09-18 MED ORDER — PREDNISONE 20 MG PO TABS: ORAL_TABLET | ORAL | 0 refills | Status: AC

## 2021-10-13 ENCOUNTER — Encounter: Payer: Self-pay | Admitting: Adult Health

## 2021-10-13 ENCOUNTER — Ambulatory Visit: Payer: 59 | Admitting: Adult Health

## 2021-10-13 ENCOUNTER — Other Ambulatory Visit: Payer: Self-pay

## 2021-10-13 VITALS — BP 98/60 | HR 71 | Temp 97.7°F | Ht 67.5 in | Wt 142.0 lb

## 2021-10-13 DIAGNOSIS — Z0000 Encounter for general adult medical examination without abnormal findings: Secondary | ICD-10-CM | POA: Diagnosis not present

## 2021-10-13 DIAGNOSIS — Z1329 Encounter for screening for other suspected endocrine disorder: Secondary | ICD-10-CM | POA: Diagnosis not present

## 2021-10-13 DIAGNOSIS — E559 Vitamin D deficiency, unspecified: Secondary | ICD-10-CM

## 2021-10-13 DIAGNOSIS — R7309 Other abnormal glucose: Secondary | ICD-10-CM

## 2021-10-13 DIAGNOSIS — M5030 Other cervical disc degeneration, unspecified cervical region: Secondary | ICD-10-CM

## 2021-10-13 DIAGNOSIS — Z1389 Encounter for screening for other disorder: Secondary | ICD-10-CM

## 2021-10-13 DIAGNOSIS — E785 Hyperlipidemia, unspecified: Secondary | ICD-10-CM

## 2021-10-13 DIAGNOSIS — Z6821 Body mass index (BMI) 21.0-21.9, adult: Secondary | ICD-10-CM

## 2021-10-13 DIAGNOSIS — Z79899 Other long term (current) drug therapy: Secondary | ICD-10-CM

## 2021-10-13 DIAGNOSIS — Z23 Encounter for immunization: Secondary | ICD-10-CM

## 2021-10-13 DIAGNOSIS — Z13228 Encounter for screening for other metabolic disorders: Secondary | ICD-10-CM | POA: Diagnosis not present

## 2021-10-13 DIAGNOSIS — Z130 Encounter for screening for diseases of the blood and blood-forming organs and certain disorders involving the immune mechanism: Secondary | ICD-10-CM

## 2021-10-13 DIAGNOSIS — Z0001 Encounter for general adult medical examination with abnormal findings: Secondary | ICD-10-CM

## 2021-10-13 NOTE — Patient Instructions (Addendum)
?Carla Morton , ?Thank you for taking time to come for your Annual Wellness Visit. I appreciate your ongoing commitment to your health goals. Please review the following plan we discussed and let me know if I can assist you in the future.  ? ?These are the goals we discussed: ? Goals   ?None ?  ?  ?This is a list of the screening recommended for you and due dates:  ?Health Maintenance  ?Topic Date Due  ? COVID-19 Vaccine (2 - Pfizer risk series) 03/30/2020  ? Flu Shot  02/28/2021  ? Zoster (Shingles) Vaccine (1 of 2) 01/13/2022*  ? Mammogram  10/18/2021  ? Tetanus Vaccine  12/20/2021  ? Colon Cancer Screening  12/22/2022  ? Pap Smear  02/29/2024  ? Hepatitis C Screening: USPSTF Recommendation to screen - Ages 51-79 yo.  Completed  ? HIV Screening  Completed  ? HPV Vaccine  Aged Out  ?*Topic was postponed. The date shown is not the original due date.  ? ? ?Know what a healthy weight is for you (roughly BMI <25) and aim to maintain this ? ?Aim for 7+ servings of fruits and vegetables daily ? ?65-80+ fluid ounces of water or unsweet tea for healthy kidneys ? ?Limit to max 1 drink of alcohol per day; avoid smoking/tobacco ? ?Limit animal fats in diet for cholesterol and heart health - choose grass fed whenever available ? ?Avoid highly processed foods, and foods high in saturated/trans fats ? ?Aim for low stress - take time to unwind and care for your mental health ? ?Aim for 150 min of moderate intensity exercise weekly for heart health, and weights twice weekly for bone health ? ?Aim for 7-9 hours of sleep daily ? ? ? ? ?High-Fiber Eating Plan ?Fiber, also called dietary fiber, is a type of carbohydrate. It is found foods such as fruits, vegetables, whole grains, and beans. A high-fiber diet can have many health benefits. Your health care provider may recommend a high-fiber diet to help: ?Prevent constipation. Fiber can make your bowel movements more regular. ?Lower your cholesterol. ?Relieve the following  conditions: ?Inflammation of veins in the anus (hemorrhoids). ?Inflammation of specific areas of the digestive tract (uncomplicated diverticulosis). ?A problem of the large intestine, also called the colon, that sometimes causes pain and diarrhea (irritable bowel syndrome, or IBS). ?Prevent overeating as part of a weight-loss plan. ?Prevent heart disease, type 2 diabetes, and certain cancers. ?What are tips for following this plan? ?Reading food labels ? ?Check the nutrition facts label on food products for the amount of dietary fiber. Choose foods that have 5 grams of fiber or more per serving. ?The goals for recommended daily fiber intake include: ?Men (age 71 or younger): 34-38 g. ?Men (over age 76): 28-34 g. ?Women (age 75 or younger): 25-28 g. ?Women (over age 46): 22-25 g. ?Your daily fiber goal is _____________ g. ?Shopping ?Choose whole fruits and vegetables instead of processed forms, such as apple juice or applesauce. ?Choose a wide variety of high-fiber foods such as avocados, lentils, oats, and kidney beans. ?Read the nutrition facts label of the foods you choose. Be aware of foods with added fiber. These foods often have high sugar and sodium amounts per serving. ?Cooking ?Use whole-grain flour for baking and cooking. ?Cook with brown rice instead of white rice. ?Meal planning ?Start the day with a breakfast that is high in fiber, such as a cereal that contains 5 g of fiber or more per serving. ?Eat breads and cereals  that are made with whole-grain flour instead of refined flour or white flour. ?Eat brown rice, bulgur wheat, or millet instead of white rice. ?Use beans in place of meat in soups, salads, and pasta dishes. ?Be sure that half of the grains you eat each day are whole grains. ?General information ?You can get the recommended daily intake of dietary fiber by: ?Eating a variety of fruits, vegetables, grains, nuts, and beans. ?Taking a fiber supplement if you are not able to take in enough fiber  in your diet. It is better to get fiber through food than from a supplement. ?Gradually increase how much fiber you consume. If you increase your intake of dietary fiber too quickly, you may have bloating, cramping, or gas. ?Drink plenty of water to help you digest fiber. ?Choose high-fiber snacks, such as berries, raw vegetables, nuts, and popcorn. ?What foods should I eat? ?Fruits ?Berries. Pears. Apples. Oranges. Avocado. Prunes and raisins. Dried figs. ?Vegetables ?Sweet potatoes. Spinach. Kale. Artichokes. Cabbage. Broccoli. Cauliflower. Green peas. Carrots. Squash. ?Grains ?Whole-grain breads. Multigrain cereal. Oats and oatmeal. Brown rice. Barley. Bulgur wheat. Millet. Quinoa. Bran muffins. Popcorn. Rye wafer crackers. ?Meats and other proteins ?Navy beans, kidney beans, and pinto beans. Soybeans. Split peas. Lentils. Nuts and seeds. ?Dairy ?Fiber-fortified yogurt. ?Beverages ?Fiber-fortified soy milk. Fiber-fortified orange juice. ?Other foods ?Fiber bars. ?The items listed above may not be a complete list of recommended foods and beverages. Contact a dietitian for more information. ?What foods should I avoid? ?Fruits ?Fruit juice. Cooked, strained fruit. ?Vegetables ?Fried potatoes. Canned vegetables. Well-cooked vegetables. ?Grains ?White bread. Pasta made with refined flour. White rice. ?Meats and other proteins ?Fatty cuts of meat. Fried chicken or fried fish. ?Dairy ?Milk. Yogurt. Cream cheese. Sour cream. ?Fats and oils ?Butters. ?Beverages ?Soft drinks. ?Other foods ?Cakes and pastries. ?The items listed above may not be a complete list of foods and beverages to avoid. Talk with your dietitian about what choices are best for you. ?Summary ?Fiber is a type of carbohydrate. It is found in foods such as fruits, vegetables, whole grains, and beans. ?A high-fiber diet has many benefits. It can help to prevent constipation, lower blood cholesterol, aid weight loss, and reduce your risk of heart disease,  diabetes, and certain cancers. ?Increase your intake of fiber gradually. Increasing fiber too quickly may cause cramping, bloating, and gas. Drink plenty of water while you increase the amount of fiber you consume. ?The best sources of fiber include whole fruits and vegetables, whole grains, nuts, seeds, and beans. ?This information is not intended to replace advice given to you by your health care provider. Make sure you discuss any questions you have with your health care provider. ?Document Revised: 11/20/2019 Document Reviewed: 11/20/2019 ?Elsevier Patient Education ? 2022 Elsevier Inc. ? ?

## 2021-10-13 NOTE — Progress Notes (Signed)
Complete Physical ? ?Assessment and Plan: ? ?Encounter for Annual Physical Exam with abnormal findings ?Due annually  ?Health Maintenance reviewed ?Healthy lifestyle reviewed and goals set ? ?Hyperlipidemia, unspecified hyperlipidemia type ?Managing by lifestyle, low risk family and personal hx ?LDL goal <100  ?decrease saturated fats, increase fiber ?increase activity. ?-     TSH ?-     Lipid panel ? ?Abnormal glucose ?-     TSH ?Discussed disease progression and risks ?Discussed diet/exercise, weight management and risk modification ? ?Vitamin D deficiency ?-     VITAMIN D 25 Hydroxy (Vit-D Deficiency, Fractures) ? ?Medication management ?-     CBC with Differential/Platelet ?-     COMPLETE METABOLIC PANEL WITH GFR ?-     Magnesium ? ?Screening for blood or protein in urine ?-     Urinalysis, Routine w reflex microscopic ? ?Deflected nasal septum ?Continue ENT prn ? ?Seasonal allergic rhinitis, unspecified trigger ?Continue OTC meds ? ?DDD (degenerative disc disease), cervical ?Monitor ? ?R knee meniscus tear ?Dr. August Saucerean following, conservative interventions; doing well; monitor ? ?Need for tetanus booster ?- Td administered without complication.  ? ? ?Orders Placed This Encounter  ?Procedures  ? Td : Tetanus/diphtheria >7yo Preservative  free  ? CBC with Differential/Platelet  ? COMPLETE METABOLIC PANEL WITH GFR  ? Lipid panel  ? TSH  ? Hemoglobin A1c  ? VITAMIN D 25 Hydroxy (Vit-D Deficiency, Fractures)  ? Urinalysis, Routine w reflex microscopic  ? ? ?Discussed med's effects and SE's. Screening labs and tests as requested with regular follow-up as recommended. ?Future Appointments  ?Date Time Provider Department Center  ?10/17/2022 10:00 AM Judd Gaudierorbett, Edwyna Dangerfield, NP [facility] None  ? ? ?HPI ?61 y.o. female  presents for a complete physical. She has Hyperlipidemia; Other abnormal glucose (hx of prediabetes) ; Vitamin D deficiency; Deflected nasal septum; Allergic rhinitis, seasonal; and DDD (degenerative disc disease),  cervical on their problem list. ? ?She is divorced. She has steady boyfriend, Loraine LericheMark, have been together for 17 years. No kids, works as Personnel officerelectrician.   ? ?She had some grief last in 2021 r/t numerous deaths in family, brother (cirrhosis), sister's fiance was murdered, father passed (dementia, fall).  ?Did do some Grief counseling, boyfriend is supportive, she feels doing better.  ? ?She is having hot flashes at night, on activella, has reduced to 1/2 tab only since last year, [facility]  She uses boric acid occ for yeast.  ? ?She follows with Dr. August Saucerean, was having left shoulder stiffness that resolved, also follows for mild R knee meniscus tear, conservative interventions only. Wears a sleeve at work.  ? ?BMI is Body mass index is 21.91 kg/m?., she is working on diet and exercise, works on elliptical daily, also fairly active job.  ?Wt Readings from Last 3 Encounters:  ?10/13/21 142 lb (64.4 kg)  ?09/14/21 141 lb 6.4 oz (64.1 kg)  ?10/13/20 140 lb (63.5 kg)  ? ?Her blood pressure has been controlled at home, today their BP is BP: 98/60 ? She does workout. She denies chest pain, shortness of breath, dizziness. ? ?Her cholesterol is treated by lifestyle.  Her cholesterol is not at goal. Denies family hx of CVD/MI. The cholesterol last visit was:  ?Lab Results  ?Component Value Date  ? CHOL 233 (H) 10/13/2020  ? HDL 83 10/13/2020  ? LDLCALC 131 (H) 10/13/2020  ? TRIG 93 10/13/2020  ? CHOLHDL 2.8 10/13/2020  ? ?She has been working on diet  and exercise for hx of mild intermittent prediabetes, and denies blurry vision, polydipsia, polyphagia and polyuria. Last A1C in the office was:  ?Lab Results  ?Component Value Date  ? HGBA1C 5.6 10/13/2020  ? ?Patient is on Vitamin D supplement, 5000 IU daily  ?Lab Results  ?Component Value Date  ? VD25OH 68 10/13/2020  ? ? ? ?Current Medications:  ?Current Outpatient Medications on File Prior to Visit  ?Medication Sig Dispense Refill  ? albuterol  (VENTOLIN HFA) 108 (90 Base) MCG/ACT inhaler Use 2 inhalations 15 minutes apart every 4 hours to rescue Asthma Attack 48 g 3  ? aspirin 81 MG EC tablet Take 81 mg by mouth daily. Swallow whole.    ? B Complex-C (SUPER B COMPLEX PO) Take 1 tablet by mouth daily.    ? cetirizine (ZYRTEC) 10 MG tablet Take 10 mg by mouth daily.    ? Cholecalciferol (VITAMIN D PO) Take 5,000 Units by mouth daily.    ? COLLAGEN PO Take by mouth. Joint Health U C 11    ? estradiol-norethindrone (ACTIVELLA) 1-0.5 MG tablet Take 1 tablet by mouth daily. 30 tablet 6  ? fluticasone (FLONASE) 50 MCG/ACT nasal spray Place 2 sprays into both nostrils daily.    ? Multiple Vitamins-Minerals (CENTRUM SILVER PO) Take 1 tablet by mouth daily.    ? TURMERIC PO Take 1,000 mg by mouth daily.    ? ?No current facility-administered medications on file prior to visit.  ? ?Health Maintenance:  ?Immunization History  ?Administered Date(s) Administered  ? Influenza Inj Mdck Quad Pf 06/09/2017, 05/09/2019  ? Influenza Split 06/09/2014  ? Influenza,inj,Quad PF,6+ Mos 05/17/2015, 05/17/2018  ? Influenza-Unspecified 05/26/2013, 05/17/2018  ? PFIZER Comirnaty(Gray Top)Covid-19 Tri-Sucrose Vaccine 03/09/2020  ? Pneumococcal Conjugate-13 06/09/2014  ? Pneumococcal-Unspecified 08/06/2010  ? Td 10/13/2021  ? Tdap 12/21/2011  ? ?Health Maintenance  ?Topic Date Due  ? INFLUENZA VACCINE  10/28/2021 (Originally 02/28/2021)  ? COVID-19 Vaccine (2 - Pfizer risk series) 10/29/2021 (Originally 03/30/2020)  ? Zoster Vaccines- Shingrix (1 of 2) 01/13/2022 (Originally 11/20/1979)  ? MAMMOGRAM  10/18/2021  ? COLONOSCOPY (Pts 45-39yrs Insurance coverage will need to be confirmed)  12/22/2022  ? PAP SMEAR-Modifier  02/29/2024  ? TETANUS/TDAP  10/14/2031  ? Hepatitis C Screening  Completed  ? HIV Screening  Completed  ? HPV VACCINES  Aged Out  ? ?Prevnar 13: 2015- had a reaction ?Shingrix: check with insurance  ?Covid 19: 03/09/2020, declines further, fatigue x 3 weeks ? ?Pap: 02/28/2021  Dr. Vickey Sages, pelvic yearly ?MGM: was getting at GYN office, had dx mmg 11/11/2020 with R abnormal, [place] suggestive of cyst, 6 months follow up US was recommended but patient reports this wasn't a new finding, declined ?DEXA: 12/2017- normal ? ?Colonoscopy: 11/2017 Dr. T[facility]- due 5 years ?EGD: 2000 with Dr. Marina Goodell, + [facility] ?(Dr. Luetta Nutting) DEE 3 years in [place] ? ?Last eye: BIL is optician, sees annually, last 09/2020, glasses ?Last dental: Goes q86m ?Last derm: [facility], goes intermittently, last 07/2021 ? ?Medical History:  ?Past Medical History:  ?Diagnosis Date  ? Allergy   ? Arthritis   ? Asthma   ? as a child  ? COVID-19 (08/23/2020) 08/25/2020  ? Diabetes ([facility])   ? pre-diabetic  ? Diverticulosis   ? GERD (gastroesophageal reflux disease)   ? Hemorrhoids   ? Hyperlipidemia   ? Hypotension   ? Vitamin D deficiency   ? ?Allergies ?Allergies  ?Allergen Reactions  ? Dexamethasone   ?  Severe  jitteriness, metallic taste in mouth  ? Montelukast   ?  Other reaction(s): Other (See Comments)  ? Singulair [Montelukast Sodium]   ? Sulfa Antibiotics   ?  Palpitations and insomnia  ? Erythromycin Rash  ? ? ?SURGICAL HISTORY ?She  has a past surgical history that includes Nasal septum surgery; Carpal tunnel release (Bilateral); and Colonoscopy (07/2011). ?FAMILY HISTORY ?Her family history includes Arthritis in her sister and sister; Cancer (age of onset: 46) in her brother; Cancer (age of onset: 66) in her father; Cirrhosis in her brother; Colon cancer in her maternal grandmother; Dementia in her father, maternal grandfather, and maternal grandmother; Other in her father. ?SOCIAL HISTORY ?She  reports that she quit smoking about 46 years ago. Her smoking use included cigarettes. She has a 8.00 pack-year smoking history. She has never used smokeless tobacco. She reports current alcohol use. She reports that she does not currently use drugs after having used the following drugs:  Marijuana. ? ?ROS ?Review of Systems  ?Constitutional:  Negative for malaise/fatigue and weight loss.  ?HENT:  Negative for hearing loss and tinnitus.   ?Eyes:  Negative for blurred vision and double vision.  ?Respiratory:

## 2021-10-14 LAB — COMPLETE METABOLIC PANEL WITH GFR
AG Ratio: 1.7 (calc) (ref 1.0–2.5)
ALT: 17 U/L (ref 6–29)
AST: 24 U/L (ref 10–35)
Albumin: 4.5 g/dL (ref 3.6–5.1)
Alkaline phosphatase (APISO): 42 U/L (ref 37–153)
BUN: 16 mg/dL (ref 7–25)
CO2: 28 mmol/L (ref 20–32)
Calcium: 10.1 mg/dL (ref 8.6–10.4)
Chloride: 103 mmol/L (ref 98–110)
Creat: 0.75 mg/dL (ref 0.50–1.05)
Globulin: 2.6 g/dL (calc) (ref 1.9–3.7)
Glucose, Bld: 81 mg/dL (ref 65–99)
Potassium: 4.8 mmol/L (ref 3.5–5.3)
Sodium: 139 mmol/L (ref 135–146)
Total Bilirubin: 0.5 mg/dL (ref 0.2–1.2)
Total Protein: 7.1 g/dL (ref 6.1–8.1)
eGFR: 91 mL/min/{1.73_m2} (ref >=60)

## 2021-10-14 LAB — URINALYSIS, ROUTINE W REFLEX MICROSCOPIC
Bilirubin Urine: NEGATIVE
Glucose, UA: NEGATIVE
Hgb urine dipstick: NEGATIVE
Ketones, ur: NEGATIVE
Leukocytes,Ua: NEGATIVE
Nitrite: NEGATIVE
Protein, ur: NEGATIVE
Specific Gravity, Urine: 1.007 (ref 1.001–1.035)
pH: 7.5 (ref 5.0–8.0)

## 2021-10-14 LAB — CBC WITH DIFFERENTIAL/PLATELET
Absolute Monocytes: 447 cells/uL (ref 200–950)
Basophils Absolute: 28 cells/uL (ref 0–200)
Basophils Relative: 0.6 %
Eosinophils Absolute: 89 cells/uL (ref 15–500)
Eosinophils Relative: 1.9 %
HCT: 38.4 % (ref 35.0–45.0)
Hemoglobin: 13 g/dL (ref 11.7–15.5)
Lymphs Abs: 1626 cells/uL (ref 850–3900)
MCH: 31.7 pg (ref 27.0–33.0)
MCHC: 33.9 g/dL (ref 32.0–36.0)
MCV: 93.7 fL (ref 80.0–100.0)
MPV: 9.5 fL (ref 7.5–12.5)
Monocytes Relative: 9.5 %
Neutro Abs: 2510 cells/uL (ref 1500–7800)
Neutrophils Relative %: 53.4 %
Platelets: 277 10*3/uL (ref 140–400)
RBC: 4.1 10*6/uL (ref 3.80–5.10)
RDW: 12.1 % (ref 11.0–15.0)
Total Lymphocyte: 34.6 %
WBC: 4.7 10*3/uL (ref 3.8–10.8)

## 2021-10-14 LAB — LIPID PANEL
Cholesterol: 229 mg/dL — ABNORMAL HIGH (ref <200)
HDL: 85 mg/dL (ref >=50)
LDL Cholesterol (Calc): 123 mg/dL (calc) — ABNORMAL HIGH
Non-HDL Cholesterol (Calc): 144 mg/dL (calc) — ABNORMAL HIGH (ref <130)
Total CHOL/HDL Ratio: 2.7 (calc) (ref <5.0)
Triglycerides: 107 mg/dL (ref <150)

## 2021-10-14 LAB — HEMOGLOBIN A1C
Hgb A1c MFr Bld: 5.6 % of total Hgb (ref <5.7)
Mean Plasma Glucose: 114 mg/dL
eAG (mmol/L): 6.3 mmol/L

## 2021-10-14 LAB — VITAMIN D 25 HYDROXY (VIT D DEFICIENCY, FRACTURES): Vit D, 25-Hydroxy: 63 ng/mL (ref 30–100)

## 2021-10-14 LAB — TSH: TSH: 0.98 mIU/L (ref 0.40–4.50)

## 2022-01-12 ENCOUNTER — Ambulatory Visit (INDEPENDENT_AMBULATORY_CARE_PROVIDER_SITE_OTHER): Payer: 59 | Admitting: Nurse Practitioner

## 2022-01-12 ENCOUNTER — Encounter: Payer: Self-pay | Admitting: Nurse Practitioner

## 2022-01-12 VITALS — BP 120/68 | HR 105 | Temp 97.7°F | Wt 141.6 lb

## 2022-01-12 DIAGNOSIS — R519 Headache, unspecified: Secondary | ICD-10-CM

## 2022-01-12 DIAGNOSIS — J0100 Acute maxillary sinusitis, unspecified: Secondary | ICD-10-CM

## 2022-01-12 DIAGNOSIS — R110 Nausea: Secondary | ICD-10-CM

## 2022-01-12 DIAGNOSIS — R062 Wheezing: Secondary | ICD-10-CM

## 2022-01-12 DIAGNOSIS — H9203 Otalgia, bilateral: Secondary | ICD-10-CM

## 2022-01-12 DIAGNOSIS — R051 Acute cough: Secondary | ICD-10-CM

## 2022-01-12 MED ORDER — IPRATROPIUM-ALBUTEROL 0.5-2.5 (3) MG/3ML IN SOLN: 3.0000 mL | Freq: Once | RESPIRATORY_TRACT | Status: AC

## 2022-01-12 NOTE — Progress Notes (Signed)
Assessment and Plan:  Carla Morton was seen today for an episodic visit.  Diagnoses and all order for this visit:  1. Acute non-recurrent maxillary sinusitis Mucinex samples provided. Continue Zyrtec, Flonase Stay well hydrated to keep mucus thin and productive.  - ipratropium-albuterol (DUONEB) 0.5-2.5 (3) MG/3ML nebulizer solution 3 mL  2. Acute intractable headache, unspecified headache type Tylenol OTC PRN. Stay well hydrated. Rest  3. Ear pain, bilateral Mucinex sample. Continue Zyrtec, Flonase  4. Nausea Bland diet.   5. Wheezing Continue Albuterol PRN  - ipratropium-albuterol (DUONEB) 0.5-2.5 (3) MG/3ML nebulizer solution 3 mL  6. Acute cough Continue Promethazine cough syrup. Stay well hydrated.  Discussed further treatment with abx if s/s fail to improve.    Continue to monitor for any increase in fever, chills, N/V, d Notify office for further evaluation and treatment, questions or concerns if s/s fail to improve. The risks and benefits of my recommendations, as well as other treatment options were discussed with the patient today. Questions were answered.  Further disposition pending results of labs. Discussed med's effects and SE's.    Over 20 minutes of exam, counseling, chart review, and critical decision making was performed.   Future Appointments  Date Time Provider Department Center  10/17/2022 10:00 AM Donald Jacque, Archie Patten, NP [facility] None    ------------------------------------------------------------------------------------------------------------------   HPI BP 120/68   Pulse (!) 105   Temp 97.7 F (36.5 C)   Wt 141 lb 9.6 oz (64.2 kg)   SpO2 97%   BMI 21.85 kg/m    Carla Morton is a 61 y.o. female who presents for evaluation of symptoms of a URI, possible sinusitis. She went to the beach last week with several friends. Symptoms include congestion, facial pain, and nausea without vomiting. Onset of symptoms was 5 days ago, and has been  unchanged since that time. Treatment to date: Promethazine cough syrup, Flonase, Zyrtec, Albuterol inhaler.  Denies fever, chills.     Past Medical History:  Diagnosis Date   Allergy    Arthritis    Asthma    as a child   COVID-19 (08/23/2020) 08/25/2020   Diabetes (HCC)    pre-diabetic   Diverticulosis    GERD (gastroesophageal reflux disease)    Hemorrhoids    Hyperlipidemia    Hypotension    Vitamin D deficiency      Allergies  Allergen Reactions   Dexamethasone     Severe jitteriness, metallic taste in mouth   Montelukast     Other reaction(s): Other (See Comments)   Singulair [Montelukast Sodium]    Sulfa Antibiotics     Palpitations and insomnia   Erythromycin Rash    Current Outpatient Medications on File Prior to Visit  Medication Sig   albuterol (VENTOLIN HFA) 108 (90 Base) MCG/ACT inhaler Use 2 inhalations 15 minutes apart every 4 hours to rescue Asthma Attack   aspirin 81 MG EC tablet Take 81 mg by mouth daily. Swallow whole.   B Complex-C (SUPER B COMPLEX PO) Take 1 tablet by mouth daily.   cetirizine (ZYRTEC) 10 MG tablet Take 10 mg by mouth daily.   Cholecalciferol (VITAMIN D PO) Take 5,000 Units by mouth daily.   COLLAGEN PO Take by mouth. Joint Health U C 11   estradiol-norethindrone (ACTIVELLA) 1-0.5 MG tablet Take 1 tablet by mouth daily.   fluticasone (FLONASE) 50 MCG/ACT nasal spray Place 2 sprays into both nostrils daily.   Multiple Vitamins-Minerals (CENTRUM SILVER PO) Take 1 tablet by mouth daily.   TURMERIC  PO Take 1,000 mg by mouth daily.   No current facility-administered medications on file prior to visit.    ROS: all negative except what is noted in the HPI.    Physical Exam:  BP 120/68   Pulse (!) 105   Temp 97.7 F (36.5 C)   Wt 141 lb 9.6 oz (64.2 kg)   SpO2 97%   BMI 21.85 kg/m   General Appearance: NAD.  Awake, conversant and cooperative. Eyes: PERRLA, EOMs intact.  Sclera white.  Conjunctiva without erythema. Sinuses:  Tender frontal/maxillary tenderness.  No nasal discharge. Nares patent.  ENT/Mouth: Ext aud canals clear.  Bilateral TMs w/DOL and without erythema or bulging. Hearing intact.  Posterior pharynx without swelling or exudate.  Tonsils without swelling or erythema.  Neck: Supple.  No masses, nodules or thyromegaly. Respiratory: Coarse inspiratory and expiratory breath sounds, posteriorly.  Mild wheezing upper posterior lung upon expiration.  Cardio: RRR with no MRGs. Brisk peripheral pulses without edema.  Abdomen: Active BS in all four quadrants.  Soft and non-tender without guarding, rebound tenderness, hernias or masses. Lymphatics: Non tender without lymphadenopathy.  Musculoskeletal: Full ROM, 5/5 strength, normal ambulation.  No clubbing or cyanosis. Skin: Appropriate color for ethnicity. Warm without rashes, lesions, ecchymosis, ulcers.  Neuro: CN II-XII grossly normal. Normal muscle tone without cerebellar symptoms and intact sensation.   Psych: AO X 3,  appropriate mood and affect, insight and judgment.     Adela Glimpse, NP 2:10 PM [facility]

## 2022-01-12 NOTE — Patient Instructions (Signed)

## 2022-01-13 ENCOUNTER — Encounter: Payer: Self-pay | Admitting: Nurse Practitioner

## 2022-01-17 ENCOUNTER — Other Ambulatory Visit: Payer: Self-pay | Admitting: Nurse Practitioner

## 2022-01-17 ENCOUNTER — Encounter: Payer: Self-pay | Admitting: Nurse Practitioner

## 2022-01-17 MED ORDER — DOXYCYCLINE HYCLATE 100 MG PO CAPS: ORAL_CAPSULE | ORAL | 0 refills | Status: DC

## 2022-02-01 ENCOUNTER — Encounter: Payer: Self-pay | Admitting: Nurse Practitioner

## 2022-03-16 DIAGNOSIS — Z6821 Body mass index (BMI) 21.0-21.9, adult: Secondary | ICD-10-CM | POA: Diagnosis not present

## 2022-03-16 DIAGNOSIS — Z01419 Encounter for gynecological examination (general) (routine) without abnormal findings: Secondary | ICD-10-CM | POA: Diagnosis not present

## 2022-03-16 DIAGNOSIS — Z13820 Encounter for screening for osteoporosis: Secondary | ICD-10-CM | POA: Diagnosis not present

## 2022-06-11 IMAGING — MG MM DIGITAL DIAGNOSTIC UNILAT*R* W/ TOMO W/ CAD
4 series · 4 of 12 positions shown · non-contrast
Comparison: Previous exam(s).

CLINICAL DATA: Possible asymmetry in the superior aspect of the
right breast in the oblique projection of a recent screening
mammogram.

EXAM:
DIGITAL DIAGNOSTIC UNILATERAL RIGHT MAMMOGRAM WITH TOMOSYNTHESIS AND
CAD; ULTRASOUND RIGHT BREAST LIMITED
TECHNIQUE: Right digital diagnostic mammography and breast tomosynthesis was
performed. The images were evaluated with computer-aided detection.;
Targeted ultrasound examination of the right breast was performed

[R ML synth-2D]
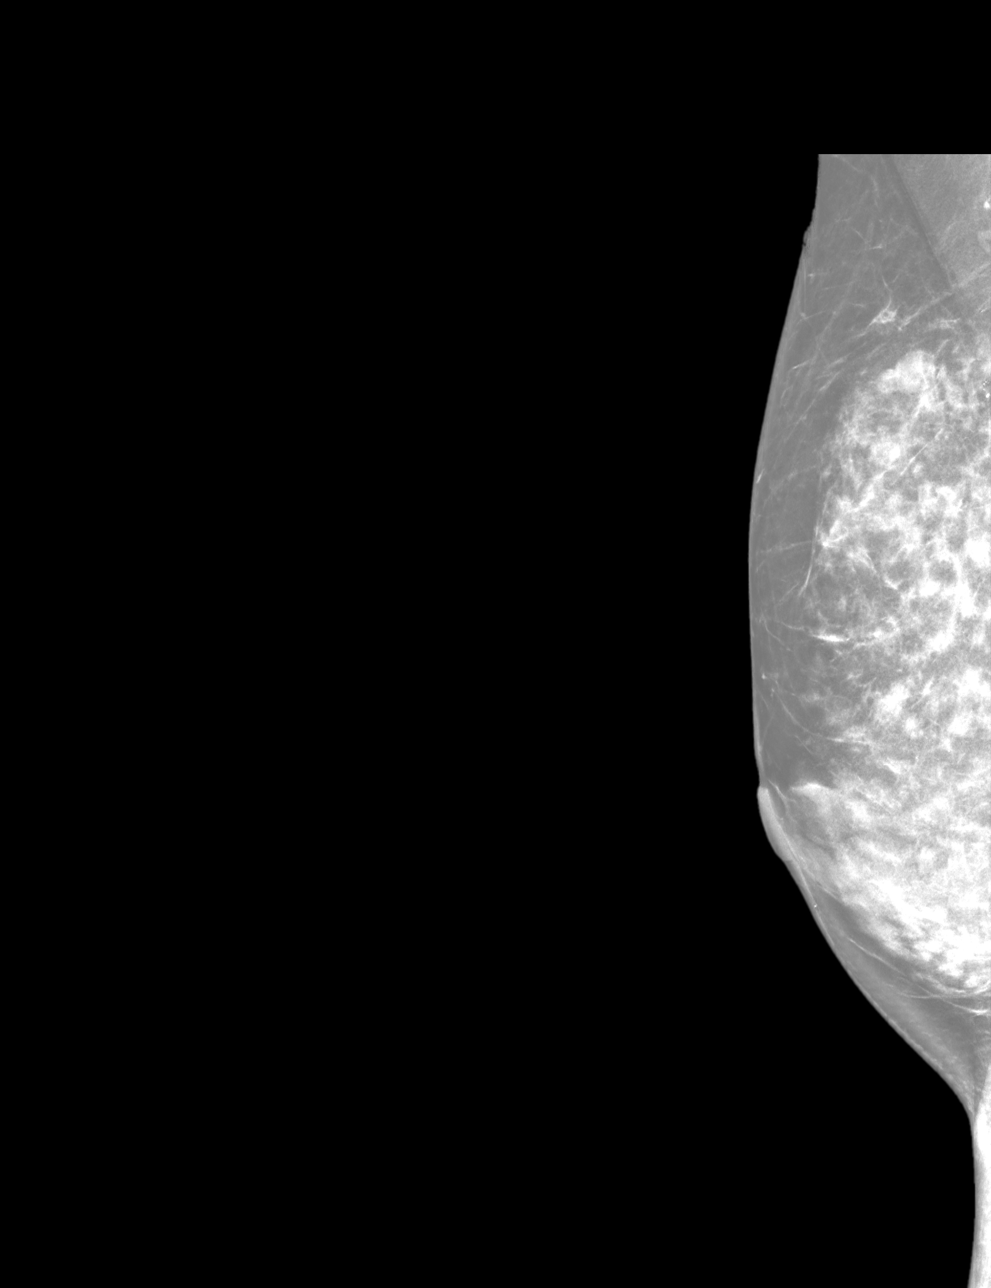

[R MLO synth-2D]
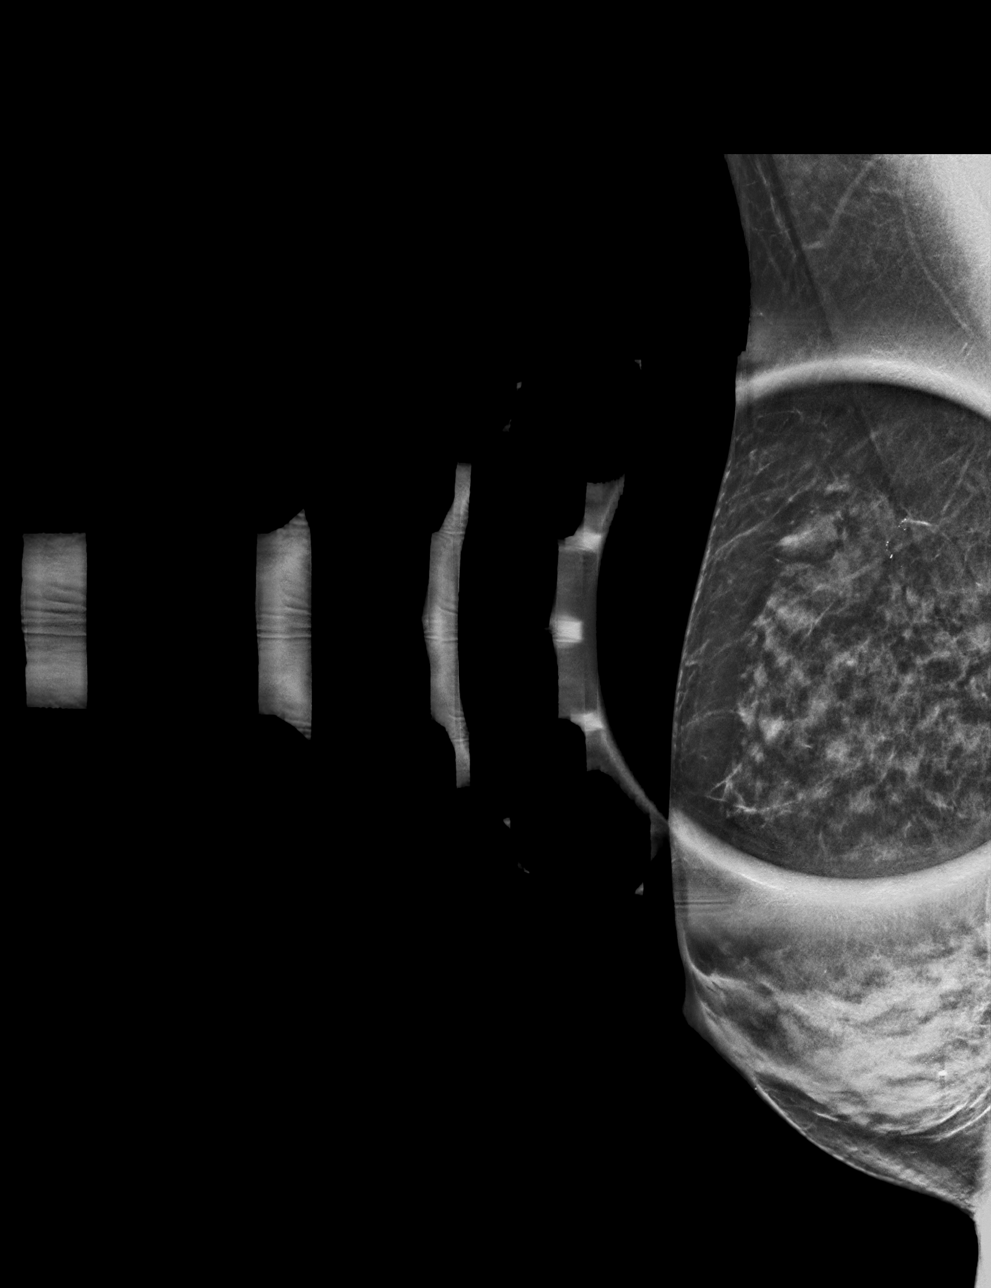

[R ML tomo · tomo slice 31/60.0]
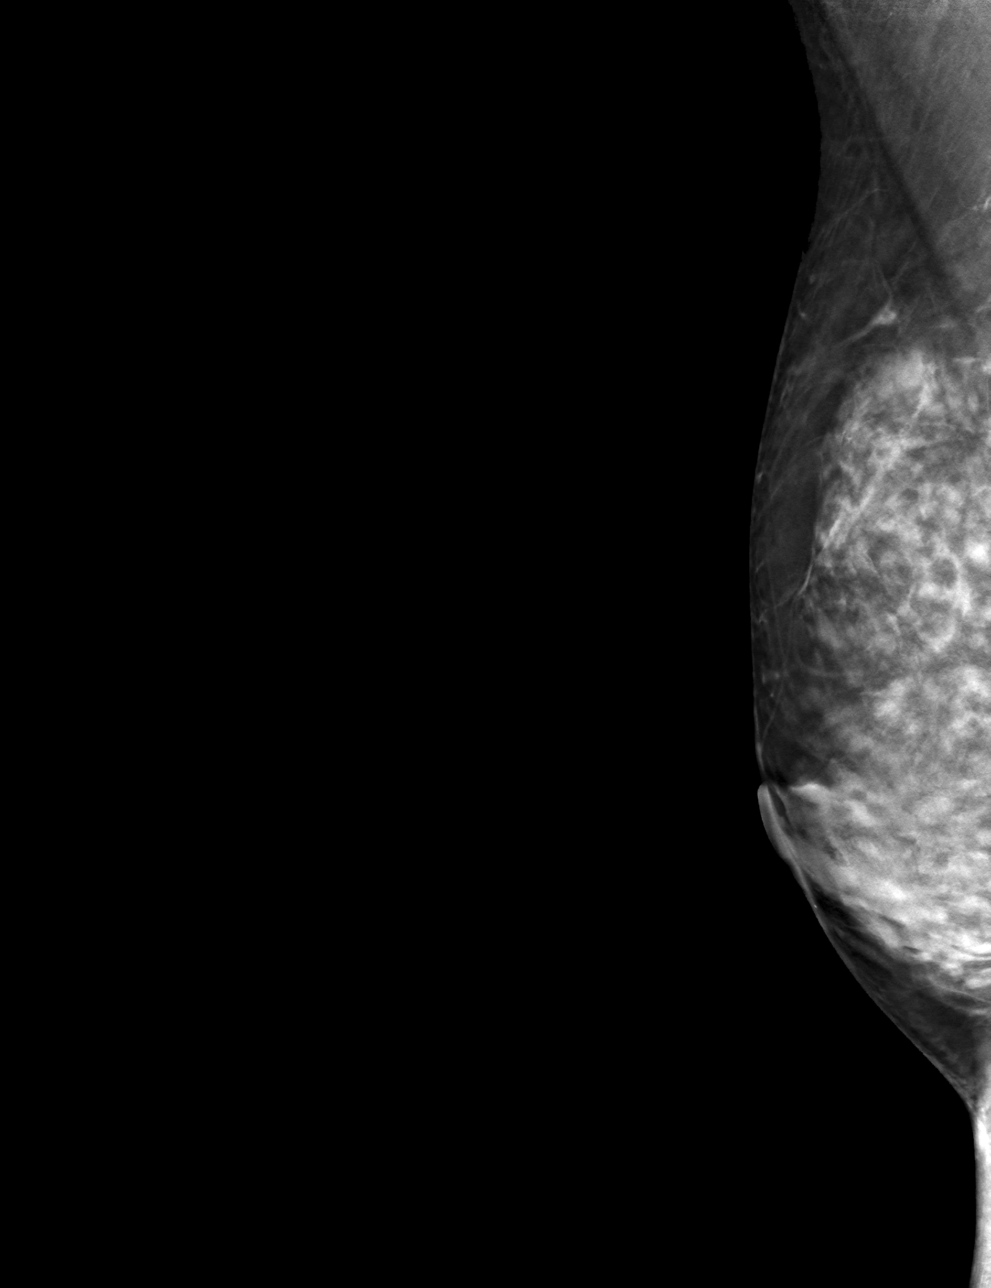

[R MLO tomo · tomo slice 25/49.0]
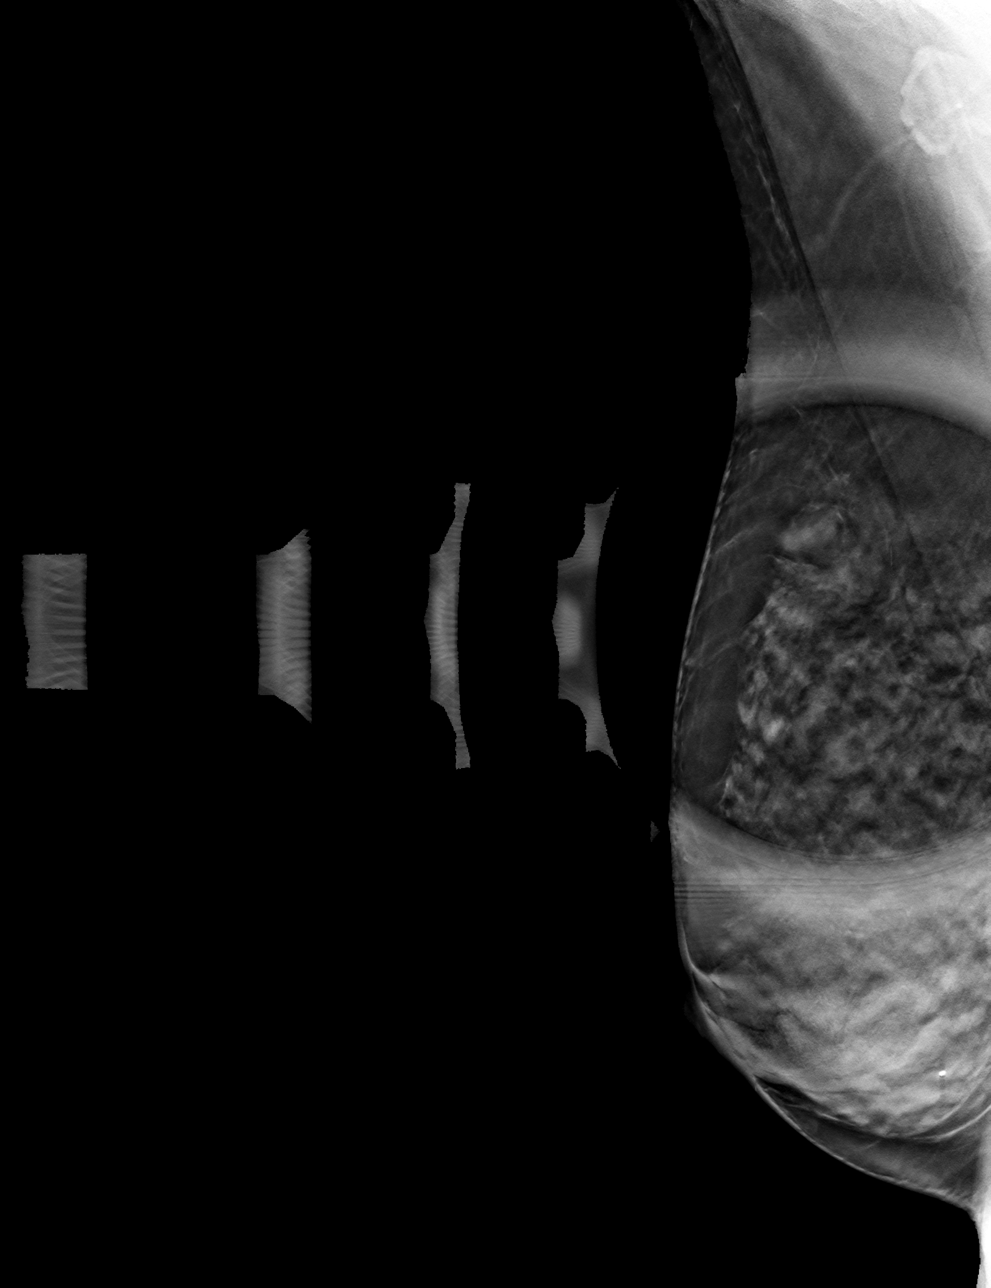

[4 of 12 positions shown; findings below may reference images not displayed]

ACR Breast Density Category c: The breast tissue is heterogeneously
dense, which may obscure small masses.
FINDINGS: 3D tomographic and 2D generated true lateral and spot compression
oblique images of the right breast confirm a 1 cm oval, bilobed,
circumscribed mass in the aspect of the upper-outer right breast.

On physical exam, the patient has an approximately 1.5 cm oval,
somewhat soft, palpable mass in the axillary tail region of the
right breast. This is in the 10:30 o'clock position, 5 cm from the
nipple.

Targeted ultrasound is performed, showing a 9 x 8 x 5 mm cluster of
cysts in the 10:30 o'clock position of the right breast, 5 cm from
the nipple, corresponding to the palpable mass and the mass seen
mammographically. This has some solid components with internal blood
flow with power Doppler. No discrete nodularity is seen.
IMPRESSION: 9 mm probably benign cluster of cysts in the 10:30 o'clock position
of the right breast.

RECOMMENDATION:
Right breast ultrasound in 6 months. The option of ultrasound-guided
core needle biopsy was also discussed with the patient but not
recommended at this time. She is currently comfortable with the 6
month followup ultrasound. The patient was instructed to return
sooner if the palpable mass becomes larger and firmer to palpation.

I have discussed the findings and recommendations with the patient.
If applicable, a reminder letter will be sent to the patient
regarding the next appointment.

BI-RADS CATEGORY  3: Probably benign.

## 2022-08-23 DIAGNOSIS — L814 Other melanin hyperpigmentation: Secondary | ICD-10-CM | POA: Diagnosis not present

## 2022-08-23 DIAGNOSIS — L821 Other seborrheic keratosis: Secondary | ICD-10-CM | POA: Diagnosis not present

## 2022-08-23 DIAGNOSIS — D2271 Melanocytic nevi of right lower limb, including hip: Secondary | ICD-10-CM | POA: Diagnosis not present

## 2022-08-23 DIAGNOSIS — D2261 Melanocytic nevi of right upper limb, including shoulder: Secondary | ICD-10-CM | POA: Diagnosis not present

## 2022-08-23 DIAGNOSIS — Z85828 Personal history of other malignant neoplasm of skin: Secondary | ICD-10-CM | POA: Diagnosis not present

## 2022-08-23 DIAGNOSIS — D2272 Melanocytic nevi of left lower limb, including hip: Secondary | ICD-10-CM | POA: Diagnosis not present

## 2022-08-23 DIAGNOSIS — D1801 Hemangioma of skin and subcutaneous tissue: Secondary | ICD-10-CM | POA: Diagnosis not present

## 2022-08-23 DIAGNOSIS — D2362 Other benign neoplasm of skin of left upper limb, including shoulder: Secondary | ICD-10-CM | POA: Diagnosis not present

## 2022-08-23 DIAGNOSIS — D2262 Melanocytic nevi of left upper limb, including shoulder: Secondary | ICD-10-CM | POA: Diagnosis not present

## 2022-08-23 DIAGNOSIS — L720 Epidermal cyst: Secondary | ICD-10-CM | POA: Diagnosis not present

## 2022-08-23 DIAGNOSIS — D225 Melanocytic nevi of trunk: Secondary | ICD-10-CM | POA: Diagnosis not present

## 2022-08-23 DIAGNOSIS — L853 Xerosis cutis: Secondary | ICD-10-CM | POA: Diagnosis not present

## 2022-09-28 NOTE — Progress Notes (Signed)
THIS ENCOUNTER IS A VIRTUAL VISIT DUE TO COVID-19 - PATIENT WAS NOT SEEN IN THE OFFICE.  PATIENT HAS CONSENTED TO VIRTUAL VISIT / TELEMEDICINE VISIT   Virtual Visit via telephone Note  I connected with  Carla Morton on 09/29/2022 by telephone.  I verified that I am speaking with the correct person using two identifiers.    I discussed the limitations of evaluation and management by telemedicine and the availability of in person appointments. The patient expressed understanding and agreed to proceed.  History of Present Illness:  Pulse (!) 51   Temp (!) 97.5 F (36.4 C)   Ht 5' 7.5" (1.715 m)   SpO2 99%   BMI 21.85 kg/m  62 y.o. patient contacted office reporting URI sx . she tested positive by test in office parking lot. OV was conducted by telephone to minimize exposure. This patient was vaccinated for covid 19, last 02/2020   Sx began 4 days ago with joint pain, fever, diarrhea, myalgias, runny nose, sore throat loss of taste, headache, fatigue/weakness, Congestion with thick yellow tinged discharge  Treatments tried so far: Sudafed, Tylenol sinus, Mucinex DM(made her very jittery), tylenol and iburofen  Exposures: friend she was around 1 week ago tested positive on Monday for covid   Medications  Current Outpatient Medications (Endocrine & Metabolic):    estradiol-norethindrone (ACTIVELLA) 1-0.5 MG tablet, Take 1 tablet by mouth daily.     Current Outpatient Medications (Respiratory):    albuterol (VENTOLIN HFA) 108 (90 Base) MCG/ACT inhaler, Use 2 inhalations 15 minutes apart every 4 hours to rescue Asthma Attack   cetirizine (ZYRTEC) 10 MG tablet, Take 10 mg by mouth daily.   fluticasone (FLONASE) 50 MCG/ACT nasal spray, Place 2 sprays into both nostrils daily.  Current Facility-Administered Medications (Respiratory):    ipratropium-albuterol (DUONEB) 0.5-2.5 (3) MG/3ML nebulizer solution 3 mL  Current Outpatient Medications (Analgesics):    aspirin 81 MG EC tablet,  Take 81 mg by mouth daily. Swallow whole.     Current Outpatient Medications (Other):    B Complex-C (SUPER B COMPLEX PO), Take 1 tablet by mouth daily.   Cholecalciferol (VITAMIN D PO), Take 5,000 Units by mouth daily.   COLLAGEN PO, Take by mouth. Joint Health U C 11   doxycycline (VIBRAMYCIN) 100 MG capsule, Take 1 capsule (100 mg) BID for 5 days.   Multiple Vitamins-Minerals (CENTRUM SILVER PO), Take 1 tablet by mouth daily.   TURMERIC PO, Take 1,000 mg by mouth daily.   Allergies:  Allergies  Allergen Reactions   Dexamethasone     Severe jitteriness, metallic taste in mouth   Montelukast     Other reaction(s): Other (See Comments)   Singulair [Montelukast Sodium]    Sulfa Antibiotics     Palpitations and insomnia   Erythromycin Rash    Problem list She has Hyperlipidemia; Other abnormal glucose (hx of prediabetes) ; Vitamin D deficiency; Deflected nasal septum; Allergic rhinitis, seasonal; and DDD (degenerative disc disease), cervical on their problem list.   Social History:   reports that she quit smoking about 47 years ago. Her smoking use included cigarettes. She has a 8.00 pack-year smoking history. She has never used smokeless tobacco. She reports current alcohol use. She reports that she does not currently use drugs after having used the following drugs: Marijuana.  Observations/Objective:  General : Well sounding patient in no apparent distress HEENT: no hoarseness, no cough for duration of visit Lungs: speaks in complete sentences, no audible wheezing, no apparent distress Neurological: alert,  oriented x 3 Psychiatric: pleasant, judgement appropriate   Assessment and Plan:  Covid 19 Covid 19 positive per rapid screening test in parking lot of office Risk factors include: has Hyperlipidemia; Other abnormal glucose (hx of prediabetes) ; Vitamin D deficiency; Deflected nasal septum; Allergic rhinitis, seasonal; and DDD (degenerative disc disease), cervical on  their problem list.  Symptoms are: mild Immue support reviewed Vit D, Vit C , Zinc Take tylenol PRN temp 101+ Push hydration Regular ambulation or calf exercises exercises for clot prevention and 81 mg ASA unless contraindicated Sx supportive therapy suggested Follow up via mychart or telephone if needed Advised patient obtain O2 monitor; present to ED if persistently <90% or with severe dyspnea, CP, fever uncontrolled by tylenol, confusion, sudden decline Should remain in isolation 5 days from testing positive and then wear a mask when around other people for the following 5 days  [place] was seen today for acute visit.  Diagnoses and all orders for this visit:  Encounter for screening for COVID-19 -     POC COVID-19- positive  Flu-like symptoms -     POCT Influenza A/B- negative  COVID -     predniSONE (DELTASONE) 5 MG tablet; 3 tablets daily with food for 3 days, 2 tabs daily for 3 days, 1 tab a day for 5 days. -     benzonatate (TESSALON PERLES) 100 MG capsule; Take 1 capsule (100 mg total) by mouth every 6 (six) hours as needed for cough. -     azithromycin (ZITHROMAX) 250 MG tablet; Take 2 tablets (500 mg) on  Day 1,  followed by 1 tablet (250 mg) once daily on Days 2 through 5.    Follow Up Instructions:  I discussed the assessment and treatment plan with the patient. The patient was provided an opportunity to ask questions and all were answered. The patient agreed with the plan and demonstrated an understanding of the instructions.   The patient was advised to call back or seek an in-person evaluation if the symptoms worsen or if the condition fails to improve as anticipated.  I provided 20 minutes of non-face-to-face time during this encounter.   Carla Rossetti, NP

## 2022-09-29 ENCOUNTER — Encounter: Payer: Self-pay | Admitting: Nurse Practitioner

## 2022-09-29 ENCOUNTER — Ambulatory Visit: Payer: 59 | Admitting: Nurse Practitioner

## 2022-09-29 ENCOUNTER — Other Ambulatory Visit: Payer: Self-pay

## 2022-09-29 VITALS — HR 51 | Temp 97.5°F | Ht 67.5 in

## 2022-09-29 DIAGNOSIS — Z1152 Encounter for screening for COVID-19: Secondary | ICD-10-CM

## 2022-09-29 DIAGNOSIS — R6889 Other general symptoms and signs: Secondary | ICD-10-CM

## 2022-09-29 DIAGNOSIS — U071 COVID-19: Secondary | ICD-10-CM | POA: Diagnosis not present

## 2022-09-29 LAB — POC COVID19 BINAXNOW: SARS Coronavirus 2 Ag: POSITIVE — AB

## 2022-09-29 LAB — POCT INFLUENZA A/B
Influenza A, POC: NEGATIVE
Influenza B, POC: NEGATIVE

## 2022-09-29 MED ORDER — BENZONATATE 100 MG PO CAPS
100.0000 mg | ORAL_CAPSULE | Freq: Four times a day (QID) | ORAL | 1 refills | Status: AC | PRN
Start: 2022-09-29 — End: 2023-09-29

## 2022-09-29 MED ORDER — PREDNISONE 5 MG PO TABS: ORAL_TABLET | ORAL | 0 refills | Status: AC

## 2022-09-29 MED ORDER — AZITHROMYCIN 250 MG PO TABS: ORAL_TABLET | ORAL | 1 refills | Status: DC

## 2022-09-29 NOTE — Patient Instructions (Signed)
Covid Immue support reviewed Vit D, Vit C , Zinc Take tylenol PRN temp 101+ Push hydration Regular ambulation or calf exercises exercises for clot prevention and 81 mg ASA unless contraindicated Sx supportive therapy suggested Follow up via mychart or telephone if needed Advised patient obtain O2 monitor; present to ED if persistently <90% or with severe dyspnea, CP, fever uncontrolled by tylenol, confusion, sudden decline Should remain in isolation 5 days from testing positive and then wear a mask when around other people for the following 5 days

## 2022-10-17 ENCOUNTER — Ambulatory Visit (INDEPENDENT_AMBULATORY_CARE_PROVIDER_SITE_OTHER): Payer: 59 | Admitting: Nurse Practitioner

## 2022-10-17 ENCOUNTER — Encounter: Payer: Self-pay | Admitting: Nurse Practitioner

## 2022-10-17 ENCOUNTER — Other Ambulatory Visit: Payer: Self-pay

## 2022-10-17 VITALS — BP 110/70 | HR 76 | Temp 98.1°F | Ht 67.25 in | Wt 138.8 lb

## 2022-10-17 DIAGNOSIS — Z136 Encounter for screening for cardiovascular disorders: Secondary | ICD-10-CM

## 2022-10-17 DIAGNOSIS — E785 Hyperlipidemia, unspecified: Secondary | ICD-10-CM

## 2022-10-17 DIAGNOSIS — Z1389 Encounter for screening for other disorder: Secondary | ICD-10-CM | POA: Diagnosis not present

## 2022-10-17 DIAGNOSIS — I10 Essential (primary) hypertension: Secondary | ICD-10-CM | POA: Diagnosis not present

## 2022-10-17 DIAGNOSIS — Z1211 Encounter for screening for malignant neoplasm of colon: Secondary | ICD-10-CM

## 2022-10-17 DIAGNOSIS — Z0001 Encounter for general adult medical examination with abnormal findings: Secondary | ICD-10-CM | POA: Diagnosis not present

## 2022-10-17 DIAGNOSIS — Z131 Encounter for screening for diabetes mellitus: Secondary | ICD-10-CM

## 2022-10-17 DIAGNOSIS — J302 Other seasonal allergic rhinitis: Secondary | ICD-10-CM

## 2022-10-17 DIAGNOSIS — R7309 Other abnormal glucose: Secondary | ICD-10-CM

## 2022-10-17 DIAGNOSIS — Z79899 Other long term (current) drug therapy: Secondary | ICD-10-CM

## 2022-10-17 DIAGNOSIS — Z0000 Encounter for general adult medical examination without abnormal findings: Secondary | ICD-10-CM | POA: Diagnosis not present

## 2022-10-17 DIAGNOSIS — E559 Vitamin D deficiency, unspecified: Secondary | ICD-10-CM

## 2022-10-17 DIAGNOSIS — M5030 Other cervical disc degeneration, unspecified cervical region: Secondary | ICD-10-CM

## 2022-10-17 DIAGNOSIS — J342 Deviated nasal septum: Secondary | ICD-10-CM

## 2022-10-17 NOTE — Patient Instructions (Signed)
Acetaminophen; Chlorpheniramine; Phenylephrine capsules or tablets What is this medication? ACETAMINOPHEN; CHLORPHENIRAMINE; PHENYLEPHRINE (a set a MEE noe fen; klor fen IR a meen; fen il EF rin) is a combination of an analgesic, antihistamine, and decongestant. It is used to treat the symptoms of allergy. This medicine may be used for other purposes; ask your health care provider or pharmacist if you have questions. COMMON BRAND NAME(S): Contac Cold + Flu, Dristan Multi-Symptom Cold, Dryphen, Norel AD, Robitussin Adult Peak Cold, Sine-Off Sinus and Cold, Tylenol Allergy Multi-Symptom, Tylenol Allergy Multi-Symptom Rapid Release, Tylenol Sinus Congestion and Pain Nighttime What should I tell my care team before I take this medication? They need to know if you have any of these conditions: blockage in your bowels diabetes (high blood sugar) glaucoma heart disease high blood pressure if you often drink alcohol kidney disease liver disease lung or breathing disease (asthma, COPD) prostate disease stomach ulcers, other stomach or intestine problems taken an MAOI such as Marplan, Nardil, or Parnate in last 14 days thyroid disease an unusual or allergic reaction to acetaminophen, chlorpheniramine, other medicines foods, dyes, or preservatives pregnant or trying to get pregnant breast-feeding How should I use this medication? Take this medicine by mouth with water. Take it as directed on the label. Do not use it more often than directed. Talk to your health care provider about the use of this medicine in children. While it may be given to children as young as 12 years for selected conditions, precautions do apply. Patients over 85 years of age may have a stronger reaction and need a smaller dose. Overdosage: If you think you have taken too much of this medicine contact a poison control center or emergency room at once. NOTE: This medicine is only for you. Do not share this medicine with  others. What if I miss a dose? This does not apply. This medicine is not for regular use. It should only be used as needed. What may interact with this medication? Do not take this medicine with any of the following medications: MAOIs like Marplan, Nardil, Parnate This medicine may also interact with the following medications: alcohol certain medicines for anxiety or sleep certain medicines for depression like amitriptyline, fluoxetine, sertraline certain medicines for seizures like phenobarbital, primidone certain medicines that treat or prevent blood clots like warfarin general anesthetics like halothane, isoflurane, methoxyflurane, propofol medicines that relax muscles for surgery narcotic medicines for pain other antihistamines for allergy, cough, and cold phenothiazines like chlorpromazine, mesoridazine, prochlorperazine, thioridazine This list may not describe all possible interactions. Give your health care provider a list of all the medicines, herbs, non-prescription drugs, or dietary supplements you use. Also tell them if you smoke, drink alcohol, or use illegal drugs. Some items may interact with your medicine. What should I watch for while using this medication? Visit your health care provider for regular checks on your progress. Tell your health care provider if your symptoms do not start to get better or if they get worse. If you need to use this medicine for more than 7 days, talk to your health care provider. Do not take other medicines that contain acetaminophen with this medicine. Many non-prescription medicines contain acetaminophen. Always read labels carefully. If you have questions, ask your health care provider. If you take too much acetaminophen, get medical help right away. Too much acetaminophen can be very dangerous and cause liver damage. Even if you do not have symptoms, it is important to get help right away. You may get drowsy  or dizzy. Do not drive, use machinery,  or do anything that needs mental alertness until you know how this medicine affects you. Do not stand or sit up quickly, especially if you are an older patient. This reduces the risk of dizzy or fainting spells. Alcohol may interfere with the effect of this medicine. Avoid alcoholic drinks. This medicine may cause serious skin reactions. They can happen weeks to months after starting the medicine. Contact your health care provider right away if you notice fevers or flu-like symptoms with a rash. The rash may be red or purple and then turn into blisters or peeling of the skin. Or, you might notice a red rash with swelling of the face, lips or lymph nodes in your neck or under your arms. Your mouth may get dry. Chewing sugarless gum or sucking hard candy and drinking plenty of water may help. Contact your health care provider if the problem does not go away or is severe. What side effects may I notice from receiving this medication? Side effects that you should report to your doctor or health care professional as soon as possible: allergic reactions (skin rash, itching or hives; swelling of the face, lips, or tongue) increase in blood pressure liver injury (dark yellow or brown urine; general ill feeling or flu-like symptoms; loss of appetite, right upper belly pain; unusually weak or tired, yellowing of the eyes or skin). light-colored stool redness, blistering, peeling, or loosening of the skin, including inside the mouth seizures trouble passing urine Side effects that usually do not require medical attention (report these to your doctor or health care professional if they continue or are bothersome): anxious dizziness dry mouth trouble sleeping unusually weak or tired This list may not describe all possible side effects. Call your doctor for medical advice about side effects. You may report side effects to FDA at 1-800-FDA-1088. Where should I keep my medication? Keep out of the reach of  children and pets. Store at [employer] C (77 degrees F). Get rid of any unused medicine after the expiration date. To get rid of medicines that are no longer needed or have expired: Take the medicine to a medicine take-back program. Check with your pharmacy or law enforcement to find a location. If you cannot return the medicine, check the label or package insert to see if the medicine should be thrown out in the garbage or flushed down the toilet. If you are not sure, ask your health care provider. If it is safe to put it in the trash, take the medicine out of the container. Mix the medicine with cat litter, dirt, coffee grounds, or other unwanted substance. Seal the mixture in a bag or container. Put it in the trash. NOTE: This sheet is a summary. It may not cover all possible information. If you have questions about this medicine, talk to your doctor, pharmacist, or health care provider.  2023 Elsevier/Gold Standard (2020-03-10 00:00:00)

## 2022-10-17 NOTE — Progress Notes (Signed)
Complete Physical  Assessment and Plan:  Encounter for Annual Physical Exam with abnormal findings Due annually  Health Maintenance reviewed  Hyperlipidemia, unspecified hyperlipidemia type Discussed lifestyle modifications. Recommended diet heavy in fruits and veggies, omega 3's. Decrease consumption of animal meats, cheeses, and dairy products. Remain active and exercise as tolerated. Continue to monitor. Check lipids/TSH  Abnormal glucose/Screening for DM Education: Reviewed 'ABCs' of diabetes management  Discussed goals to be met and/or maintained include A1C (<7) Blood pressure (<130/80) Cholesterol (LDL <70) Continue Eye Exam yearly  Continue Dental Exam Q6 mo Discussed dietary recommendations Discussed Physical Activity recommendations Check A1C  Vitamin D deficiency Continue supplement for goal of 60-100 Monitor Vitamin D levels  Medication management All medications discussed and reviewed in full. All questions and concerns regarding medications addressed.     Screening for blood or protein in urine Check and monitor Urinalysis/Microalbumin Routine w reflex microscopic  Deflected nasal septum Resolved Septoplasty Dr. Wilburn Cornelia ENT  Seasonal allergic rhinitis, unspecified trigger Continue OTC antihistamine Avoid triggers Continue to monitor   DDD (degenerative disc disease), cervical Monitor  Screening for Cardiovascular Condition  Monitor EKG  Screening for colon cancer Referral to GI  Orders Placed This Encounter  Procedures   CBC with Differential/Platelet   COMPLETE METABOLIC PANEL WITH GFR   Magnesium   Lipid panel   TSH   Hemoglobin A1c   Insulin, random   VITAMIN D 25 Hydroxy (Vit-D Deficiency, Fractures)   Urinalysis, Routine w reflex microscopic   Microalbumin / creatinine urine ratio   EKG 12-Lead    Notify office for further evaluation and treatment, questions or concerns if any reported s/s fail to improve.   The patient  was advised to call back or seek an in-person evaluation if any symptoms worsen or if the condition fails to improve as anticipated.   Further disposition pending results of labs. Discussed med's effects and SE's.    I discussed the assessment and treatment plan with the patient. The patient was provided an opportunity to ask questions and all were answered. The patient agreed with the plan and demonstrated an understanding of the instructions.  Discussed med's effects and SE's. Screening labs and tests as requested with regular follow-up as recommended.  I provided 30 minutes of face-to-face time during this encounter including counseling, chart review, and critical decision making was preformed.  Today's Plan of Care is based on a patient-centered health care approach known as shared decision making - the decisions, tests and treatments allow for patient preferences and values to be balanced with clinical evidence.    Future Appointments  Date Time Provider [place]  10/17/2023 10:00 AM Darrol Jump, NP [facility] None    HPI 62 y.o. female  presents for a complete physical. She has Hyperlipidemia; Other abnormal glucose (hx of prediabetes) ; Vitamin D deficiency; Deflected nasal septum; Allergic rhinitis, seasonal; and DDD (degenerative disc disease), cervical on their problem list.  Overall she reports feeling well today.   She is divorced. She has steady boyfriend, Elta Guadeloupe, have been together for 17 years. No kids, works as Clinical biochemist.    [facility]  She uses boric acid occ for yeast.   She follows with Dr. Marlou Sa, was having left shoulder stiffness that resolved, also follows for mild R knee meniscus tear, conservative interventions only. Wears a sleeve at work.   BMI is Body mass index is 21.58 kg/m., she is working on diet and exercise, works on elliptical  daily, also fairly active job.  Wt Readings from Last 3 Encounters:   10/17/22 138 lb 12.8 oz (63 kg)  01/12/22 141 lb 9.6 oz (64.2 kg)  10/13/21 142 lb (64.4 kg)   Her blood pressure has been controlled at home, today their BP is BP: 110/70  She does workout. She denies chest pain, shortness of breath, dizziness.  Her cholesterol is treated by lifestyle.  Her cholesterol is not at goal. Denies family hx of CVD/MI. The cholesterol last visit was:  Lab Results  Component Value Date   CHOL 229 (H) 10/13/2021   HDL 85 10/13/2021   LDLCALC 123 (H) 10/13/2021   TRIG 107 10/13/2021   CHOLHDL 2.7 10/13/2021   She has been working on diet and exercise for hx of mild intermittent prediabetes, and denies blurry vision, polydipsia, polyphagia and polyuria. Last A1C in the office was:  Lab Results  Component Value Date   HGBA1C 5.6 10/13/2021   Patient is on Vitamin D supplement, 5000 IU daily  Lab Results  Component Value Date   VD25OH 63 10/13/2021     Current Medications:  Current Outpatient Medications on File Prior to Visit  Medication Sig Dispense Refill   albuterol (VENTOLIN HFA) 108 (90 Base) MCG/ACT inhaler Use 2 inhalations 15 minutes apart every 4 hours to rescue Asthma Attack 48 g 3   aspirin 81 MG EC tablet Take 81 mg by mouth daily. Swallow whole.     B Complex-C (SUPER B COMPLEX PO) Take 1 tablet by mouth daily.     cetirizine (ZYRTEC) 10 MG tablet Take 10 mg by mouth daily.     Cholecalciferol (VITAMIN D PO) Take 5,000 Units by mouth daily.     COLLAGEN PO Take by mouth. Joint Health U C 11     estradiol-norethindrone (ACTIVELLA) 1-0.5 MG tablet Take 1 tablet by mouth daily. 30 tablet 6   fluticasone (FLONASE) 50 MCG/ACT nasal spray Place 2 sprays into both nostrils daily.     IBUPROFEN PO Take by mouth.     Multiple Vitamins-Minerals (CENTRUM SILVER PO) Take 1 tablet by mouth daily.     TURMERIC PO Take 1,000 mg by mouth daily.     azithromycin (ZITHROMAX) 250 MG tablet Take 2 tablets (500 mg) on  Day 1,  followed by 1 tablet (250 mg)  once daily on Days 2 through 5. 6 each 1   benzonatate (TESSALON PERLES) 100 MG capsule Take 1 capsule (100 mg total) by mouth every 6 (six) hours as needed for cough. (Patient not taking: Reported on 10/17/2022) 30 capsule 1   Current Facility-Administered Medications on File Prior to Visit  Medication Dose Route Frequency Provider Last Rate Last Admin   ipratropium-albuterol (DUONEB) 0.5-2.5 (3) MG/3ML nebulizer solution 3 mL  3 mL Nebulization Once Darrol Jump, NP       Health Maintenance:  Immunization History  Administered Date(s) Administered   Influenza Inj Mdck Quad Pf 06/09/2017, 05/09/2019   Influenza Split 06/09/2014   Influenza,inj,Quad PF,6+ Mos 05/17/2015, 05/17/2018   Influenza-Unspecified 05/26/2013, 05/17/2018   PFIZER Comirnaty(Gray Top)Covid-19 Tri-Sucrose Vaccine 03/09/2020   Pneumococcal Conjugate-13 06/09/2014   Pneumococcal-Unspecified 08/06/2010   Td 10/13/2021   Tdap 12/21/2011   Health Maintenance  Topic Date Due   Zoster Vaccines- Shingrix (1 of 2) Never done   COVID-19 Vaccine (2 - Pfizer risk series) 03/30/2020   MAMMOGRAM  10/18/2021   INFLUENZA VACCINE  02/28/2022   COLONOSCOPY (Pts 45-20yrs Insurance coverage will need to be confirmed)  12/22/2022   PAP SMEAR-Modifier  02/29/2024   DTaP/Tdap/Td (3 - Td or Tdap) 10/14/2031   Hepatitis C Screening  Completed   HIV Screening  Completed   HPV VACCINES  Aged Out   Prevnar 13: 2015- had a reaction Shingrix: check with insurance  Covid 19: 03/09/2020, declines further, fatigue x 3 weeks  Pap: 02/28/2021 Dr. Orvan Seen, pelvic yearly MGM: was getting at GYN office, had dx mmg 11/11/2020 with R abnormal, [place] suggestive of cyst, 6 months follow up US was recommended but patient reports this wasn't a new finding, declined - continues to follow with GYN, report requested. DEXA: 12/2017- normal  Colonoscopy: 11/2017 Dr. W[facility]- due 5 years - Due  EGD: 2000 with Dr. Henrene Pastor, + [facility] (Dr.  Vernell Leep) [place] 3 years in [place]  Last eye: BIL is optician, sees annually, last 09/2020, glasses - due  Last dental: Goes q28m  Last derm: [facility], goes intermittently, last 07/2022  Medical History:  Past Medical History:  Diagnosis Date   Allergy    Arthritis    Asthma    as a child   COVID-19 (08/23/2020) 08/25/2020   Diabetes ([place])    pre-diabetic   Diverticulosis    GERD (gastroesophageal reflux disease)    Hemorrhoids    Hyperlipidemia    Hypotension    Vitamin D deficiency    Allergies Allergies  Allergen Reactions   Dexamethasone     Severe jitteriness, metallic taste in mouth   Montelukast     Other reaction(s): Other (See Comments)   Singulair [Montelukast Sodium]    Sulfa Antibiotics     Palpitations and insomnia   Erythromycin Rash    SURGICAL HISTORY She  has a past surgical history that includes Nasal septum surgery; Carpal tunnel release (Bilateral); and Colonoscopy (07/2011). FAMILY HISTORY Her family history includes Arthritis in her sister and sister; Cancer (age of onset: 29) in her brother; Cancer (age of onset: 93) in her father; Cirrhosis in her brother; Colon cancer in her maternal grandmother; Dementia in her father, maternal grandfather, and maternal grandmother; Other in her father. SOCIAL HISTORY She  reports that she quit smoking about 47 years ago. Her smoking use included cigarettes. She has a 8.00 pack-year smoking history. She has never used smokeless tobacco. She reports current alcohol use. She reports that she does not currently use drugs after having used the following drugs: Marijuana.  ROS Review of Systems  Constitutional:  Negative for malaise/fatigue and weight loss.  HENT:  Negative for hearing loss and tinnitus.   Eyes:  Negative for blurred vision and double vision.  Respiratory:  Negative for cough, shortness of breath and wheezing.   Cardiovascular:  Negative for chest pain, palpitations, orthopnea, claudication and  leg swelling.  Gastrointestinal:  Negative for abdominal pain, blood in stool, constipation, diarrhea, heartburn, melena, nausea and vomiting.  Genitourinary: Negative.   Musculoskeletal:  Negative for joint pain and myalgias.  Skin:  Negative for rash.  Neurological:  Negative for dizziness, tingling, sensory change, weakness and headaches.  Endo/Heme/Allergies:  Negative for polydipsia.  Psychiatric/Behavioral: Negative.    All other systems reviewed and are negative.   Physical Exam: Estimated body mass index is 21.58 kg/m as calculated from the following:   Height as of this encounter: 5' 7.25" (1.708 m).   Weight as of this encounter: 138 lb 12.8 oz (63 kg). Vitals:   10/17/22 0957  BP: 110/70  Pulse: 76  Temp: 98.1 F (36.7 C)  SpO2:  99%   General Appearance: Well nourished, in no apparent distress. Eyes: PERRLA, EOMs, conjunctiva no swelling or erythema  Sinuses: No Frontal/maxillary tenderness ENT/Mouth: Ext aud canals clear, normal light reflex with TMs without erythema, bulging.  Good dentition. No erythema, swelling, or exudate on post pharynx. Tonsils not swollen or erythematous. Hearing normal.  Neck: Supple, thyroid normal. No bruits Respiratory: Respiratory effort normal, BS equal bilaterally without rales, rhonchi, wheezing or stridor. Cardio: RRR without murmurs, rubs or gallops. Brisk peripheral pulses without edema.  Chest: symmetric, with normal excursions and percussion. Breasts: defer to GYN Abdomen: Soft, +BS. Non tender, no guarding, rebound, hernias, masses, or organomegaly. .  Lymphatics: Non tender without lymphadenopathy.  Genitourinary: defer to GYN Musculoskeletal: Full ROM all peripheral extremities,5/5 strength, and normal gait. No laxity right knee, clicking/popping.  Skin: Warm, dry without rashes, lesions, ecchymosis. Neuro: Cranial nerves intact, reflexes equal bilaterally. Normal muscle tone, no cerebellar symptoms. Sensation intact.  Psych:  Awake and oriented X 3, normal affect, Insight and Judgment appropriate.   EKG: NSR, IRBBB in 2022, low risk, defer this year after discussion. Continue to monitor   Draco Malczewski, NP-C 10:12 AM

## 2022-10-18 LAB — COMPLETE METABOLIC PANEL WITH GFR
AG Ratio: 1.9 (calc) (ref 1.0–2.5)
ALT: 21 U/L (ref 6–29)
AST: 25 U/L (ref 10–35)
Albumin: 4.4 g/dL (ref 3.6–5.1)
Alkaline phosphatase (APISO): 43 U/L (ref 37–153)
BUN: 14 mg/dL (ref 7–25)
CO2: 26 mmol/L (ref 20–32)
Calcium: 9.4 mg/dL (ref 8.6–10.4)
Chloride: 105 mmol/L (ref 98–110)
Creat: 0.65 mg/dL (ref 0.50–1.05)
Globulin: 2.3 g/dL (calc) (ref 1.9–3.7)
Glucose, Bld: 83 mg/dL (ref 65–99)
Potassium: 4.3 mmol/L (ref 3.5–5.3)
Sodium: 140 mmol/L (ref 135–146)
Total Bilirubin: 0.6 mg/dL (ref 0.2–1.2)
Total Protein: 6.7 g/dL (ref 6.1–8.1)
eGFR: 99 mL/min/{1.73_m2} (ref >=60)

## 2022-10-18 LAB — TSH: TSH: 0.96 mIU/L (ref 0.40–4.50)

## 2022-10-18 LAB — CBC WITH DIFFERENTIAL/PLATELET
Absolute Monocytes: 505 cells/uL (ref 200–950)
Basophils Absolute: 20 cells/uL (ref 0–200)
Basophils Relative: 0.4 %
Eosinophils Absolute: 60 cells/uL (ref 15–500)
Eosinophils Relative: 1.2 %
HCT: 35.2 % (ref 35.0–45.0)
Hemoglobin: 11.9 g/dL (ref 11.7–15.5)
Lymphs Abs: 1450 cells/uL (ref 850–3900)
MCH: 31.2 pg (ref 27.0–33.0)
MCHC: 33.8 g/dL (ref 32.0–36.0)
MCV: 92.1 fL (ref 80.0–100.0)
MPV: 9.7 fL (ref 7.5–12.5)
Monocytes Relative: 10.1 %
Neutro Abs: 2965 cells/uL (ref 1500–7800)
Neutrophils Relative %: 59.3 %
Platelets: 245 10*3/uL (ref 140–400)
RBC: 3.82 10*6/uL (ref 3.80–5.10)
RDW: 12 % (ref 11.0–15.0)
Total Lymphocyte: 29 %
WBC: 5 10*3/uL (ref 3.8–10.8)

## 2022-10-18 LAB — MICROALBUMIN / CREATININE URINE RATIO
Creatinine, Urine: 22 mg/dL (ref 20–275)
Microalb Creat Ratio: 14 mg/g creat (ref <30)
Microalb, Ur: 0.3 mg/dL

## 2022-10-18 LAB — URINALYSIS, ROUTINE W REFLEX MICROSCOPIC
Bilirubin Urine: NEGATIVE
Glucose, UA: NEGATIVE
Hgb urine dipstick: NEGATIVE
Ketones, ur: NEGATIVE
Leukocytes,Ua: NEGATIVE
Nitrite: NEGATIVE
Protein, ur: NEGATIVE
Specific Gravity, Urine: 1.005 (ref 1.001–1.035)
pH: 6 (ref 5.0–8.0)

## 2022-10-18 LAB — HEMOGLOBIN A1C
Hgb A1c MFr Bld: 6 % of total Hgb — ABNORMAL HIGH (ref <5.7)
Mean Plasma Glucose: 126 mg/dL
eAG (mmol/L): 7 mmol/L

## 2022-10-18 LAB — LIPID PANEL
Cholesterol: 215 mg/dL — ABNORMAL HIGH (ref <200)
HDL: 73 mg/dL (ref >=50)
LDL Cholesterol (Calc): 126 mg/dL (calc) — ABNORMAL HIGH
Non-HDL Cholesterol (Calc): 142 mg/dL (calc) — ABNORMAL HIGH (ref <130)
Total CHOL/HDL Ratio: 2.9 (calc) (ref <5.0)
Triglycerides: 66 mg/dL (ref <150)

## 2022-10-18 LAB — VITAMIN D 25 HYDROXY (VIT D DEFICIENCY, FRACTURES): Vit D, 25-Hydroxy: 87 ng/mL (ref 30–100)

## 2022-10-18 LAB — MAGNESIUM: Magnesium: 2.2 mg/dL (ref 1.5–2.5)

## 2022-10-18 LAB — INSULIN, RANDOM: Insulin: 2.4 u[IU]/mL

## 2023-03-07 ENCOUNTER — Ambulatory Visit: Payer: 59 | Admitting: Nurse Practitioner

## 2023-03-07 ENCOUNTER — Encounter: Payer: Self-pay | Admitting: Nurse Practitioner

## 2023-03-07 VITALS — BP 98/62 | HR 80 | Temp 98.4°F | Ht 67.5 in | Wt 138.4 lb

## 2023-03-07 DIAGNOSIS — S46812A Strain of other muscles, fascia and tendons at shoulder and upper arm level, left arm, initial encounter: Secondary | ICD-10-CM

## 2023-03-07 DIAGNOSIS — M25512 Pain in left shoulder: Secondary | ICD-10-CM

## 2023-03-07 NOTE — Progress Notes (Signed)
Assessment and Plan:  Carla Morton was seen today for an episodic visit.  Diagnoses and all order for this visit:  1. Acute pain of left shoulder Continue sling PRN but limit to only PRN to prevent increase risk for frozen shoulder Defers tmt with muscle relaxer, gabapentin, steroids.  Can take 1,000 mg Acetaminophen QID.   May take 200 mg - 400 mg IBU in between Acetaminophen doses PRN. Continue to apply topical anti-inflammatory Contact office if s/s fail to improve in 1-2 weeks or worsen for further review and evaluation, possible imaging.    2. Strain of left trapezius muscle, initial encounter Continue to monitor  Notify office for further evaluation and treatment, questions or concerns if s/s fail to improve. The risks and benefits of my recommendations, as well as other treatment options were discussed with the patient today. Questions were answered.  Further disposition pending results of labs. Discussed med's effects and SE's.    Over 15 minutes of exam, counseling, chart review, and critical decision making was performed.   Future Appointments  Date Time Provider Department Center  10/17/2023 10:00 AM Carla Glimpse, NP [facility] None    ------------------------------------------------------------------------------------------------------------------   HPI BP 98/62   Pulse 80   Temp 98.4 F (36.9 C)   Ht 5' 7.5" (1.715 m)   Wt 138 lb 6.4 oz (62.8 kg)   SpO2 98%   BMI 21.36 kg/m   62 y.o.female presents for evaluation of left shoulder pain after cutting down a limb of a tree on 02/26/23.  Reports that she was reaching high and pulling on a branch while using an electric saw when the branch broke in her left hand and was pulled downward, off balance.  She did not fall, felt no pop and did not seem injured after the incident and kept working cutting down the limbs.  She did not feel any pain until approximately 12 hours later when she woke the  next morning.  She  reports Carla Morton with occasional mild pain, but does not more pain when left arm is hanging by her side.  She states pain is intermittent and at times a 10/10.  Pain can radiate down back of arm to elbow and up right and left lateral sides neck. She sits and waits until pain passes before she is able to get back up and move.  This takes about 15-20 min.  At times when she stands and starts to move around, pain is worse. Best when lying.  She is currently using a sling for support as the position with the left arm across the abd is most comfortable. She has been taking tylenol 325 mg and aleve 220 mg with effectiveness. Has tried ice and eat and lidocaine topicals. She denies skin changes, bruising, swelling, N/V, fever.    Past Medical History:  Diagnosis Date   Allergy    Arthritis    Asthma    as a child   COVID-19 (08/23/2020) 08/25/2020   Diabetes ([facility])    pre-diabetic   Diverticulosis    GERD (gastroesophageal reflux disease)    Hemorrhoids    Hyperlipidemia    Hypotension    Vitamin D deficiency      Allergies  Allergen Reactions   Dexamethasone     Severe jitteriness, metallic taste in mouth   Montelukast     Other reaction(s): Other (See Comments)   Singulair [Montelukast Sodium]    Sulfa Antibiotics     Palpitations and insomnia   Erythromycin Rash  Current Outpatient Medications on File Prior to Visit  Medication Sig   albuterol (VENTOLIN HFA) 108 (90 Base) MCG/ACT inhaler Use 2 inhalations 15 minutes apart every 4 hours to rescue Asthma Attack   aspirin 81 MG EC tablet Take 81 mg by mouth daily. Swallow whole.   B Complex-C (SUPER B COMPLEX PO) Take 1 tablet by mouth daily.   benzonatate (TESSALON PERLES) 100 MG capsule Take 1 capsule (100 mg total) by mouth every 6 (six) hours as needed for cough.   cetirizine (ZYRTEC) 10 MG tablet Take 10 mg by mouth daily.   Cholecalciferol (VITAMIN D PO) Take 5,000 Units by mouth daily.   COLLAGEN PO Take by mouth. Joint Health U C  11   estradiol-norethindrone (ACTIVELLA) 1-0.5 MG tablet Take 1 tablet by mouth daily.   fluticasone (FLONASE) 50 MCG/ACT nasal spray Place 2 sprays into both nostrils daily.   IBUPROFEN PO Take by mouth.   Multiple Vitamins-Minerals (CENTRUM SILVER PO) Take 1 tablet by mouth daily.   TURMERIC PO Take 1,000 mg by mouth daily.   Current Facility-Administered Medications on File Prior to Visit  Medication   ipratropium-albuterol (DUONEB) 0.5-2.5 (3) MG/3ML nebulizer solution 3 mL    ROS: all negative except what is noted in the HPI.   Physical Exam:  BP 98/62   Pulse 80   Temp 98.4 F (36.9 C)   Ht 5' 7.5" (1.715 m)   Wt 138 lb 6.4 oz (62.8 kg)   SpO2 98%   BMI 21.36 kg/m   General Appearance:  Left arm sling. NAD.  Awake, conversant and cooperative. Eyes: PERRLA, EOMs intact.  Sclera white.  Conjunctiva without erythema. Sinuses: No frontal/maxillary tenderness.  No nasal discharge. Nares patent.  ENT/Mouth: Ext aud canals clear.  Bilateral TMs w/DOL and without erythema or bulging. Hearing intact.  Posterior pharynx without swelling or exudate.  Tonsils without swelling or erythema.  Neck: Supple.  No masses, nodules or thyromegaly. Respiratory: Effort is regular with non-labored breathing. Breath sounds are equal bilaterally without rales, rhonchi, wheezing or stridor.  Cardio: RRR with no MRGs. Brisk peripheral pulses without edema.  Abdomen: Active BS in all four quadrants.  Soft and non-tender without guarding, rebound tenderness, hernias or masses. Lymphatics: Non tender without lymphadenopathy.  Musculoskeletal: Tender to palpation along lower trapezium muscle just above the inferior border of the lateral scapula.  Full ROM, 5/5 strength. Skin: Appropriate color for ethnicity. Warm without rashes, lesions, ecchymosis, ulcers.  Neuro: CN II-XII grossly normal. Normal muscle tone without cerebellar symptoms and intact sensation.   Psych: AO X 3,  appropriate mood and affect,  insight and judgment.     Carla Glimpse, NP 9:40 AM [facility]

## 2023-03-07 NOTE — Patient Instructions (Signed)
RICE Therapy for Routine Care of Injuries Many injuries can be cared for with rest, ice, compression, and elevation (RICE therapy). This includes: Resting the injured body part. Putting ice on the injury. Putting pressure (compression) on the injury. Raising the injured part (elevation). Using RICE therapy can help to lessen pain and swelling. Supplies needed: Ice. Plastic bag. Towel. Elastic bandage. Pillow or pillows to raise your injured body part. How to care for your injury with RICE therapy Rest Try to rest the injured part of your body. You can go back to your normal activities when your doctor says it is okay to do them and when you can do them without pain. If you rest the injury too much, it may not heal as well. Some injuries heal better with early movement instead of resting for too long. Ask your doctor if you should do exercises to help your injury get better. Ice  If told, put ice on the injured area. To do this: Put ice in a plastic bag. Place a towel between your skin and the bag. Leave the ice on for 20 minutes, 2-3 times a day. Take off the ice if your skin turns bright red. This is very important. If you cannot feel pain, heat, or cold, you have a greater risk of damage to the area. Do not put ice on your bare skin. Use ice for as many days as your doctor tells you to use it. Compression Put pressure on the injured area. This can be done with an elastic bandage. If this type of bandage has been put on your injury: Follow instructions on the package the bandage came in about how to use it. Do not wrap the bandage too tightly. Wrap the bandage more loosely if part of your body beyond the bandage is blue, swollen, cold, painful, or loses feeling. Take off the bandage and put it on again every 3-4 hours or as told by your doctor. See your doctor if the bandage seems to make your problems worse.  Elevation Raise the injured area above the level of your heart while you  are sitting or lying down. Follow these instructions at home: If your symptoms get worse or last a long time, make a follow-up appointment with your doctor. You may need to have imaging tests, such as X-rays or an MRI. If you have imaging tests, ask how to get your results when they are ready. Return to your normal activities when your doctor says that it is safe. Keep all follow-up visits. Contact a doctor if: You keep having pain and swelling. Your symptoms get worse. Get help right away if: You have sudden, very bad pain at your injury or lower than your injury. You have redness or more swelling around your injury. You have tingling or numbness at your injury or lower than your injury, and it does not go away when you take off the bandage. Summary Many injuries can be cared for using rest, ice, compression, and elevation (RICE therapy). You can go back to your normal activities when your doctor says it is okay and when you can do them without pain. Put ice on the injured area as told by your doctor. Get help if your symptoms get worse or if you keep having pain and swelling. This information is not intended to replace advice given to you by your health care provider. Make sure you discuss any questions you have with your health care provider. Document Revised: 05/06/2020 Document Reviewed: 05/06/2020  Elsevier Patient Education  2024 [employer]  

## 2023-03-09 ENCOUNTER — Other Ambulatory Visit: Payer: Self-pay | Admitting: Nurse Practitioner

## 2023-03-09 ENCOUNTER — Telehealth: Payer: Self-pay | Admitting: Nurse Practitioner

## 2023-03-09 DIAGNOSIS — M25512 Pain in left shoulder: Secondary | ICD-10-CM

## 2023-03-09 DIAGNOSIS — S46812A Strain of other muscles, fascia and tendons at shoulder and upper arm level, left arm, initial encounter: Secondary | ICD-10-CM

## 2023-03-09 MED ORDER — CYCLOBENZAPRINE HCL 5 MG PO TABS: 5.0000 mg | ORAL_TABLET | Freq: Three times a day (TID) | ORAL | 0 refills | Status: AC | PRN

## 2023-03-09 NOTE — Telephone Encounter (Signed)
Patient states that she declined the muscle relaxer you offered to prescribe her at her visit, but that she has decided she would like it called in. The pain has been awful the past two days and is radiating from her neck into her shoulder. She would like it sent to [facility] on [address].

## 2023-04-04 DIAGNOSIS — Z01419 Encounter for gynecological examination (general) (routine) without abnormal findings: Secondary | ICD-10-CM | POA: Diagnosis not present

## 2023-04-04 DIAGNOSIS — Z6821 Body mass index (BMI) 21.0-21.9, adult: Secondary | ICD-10-CM | POA: Diagnosis not present

## 2023-04-04 DIAGNOSIS — N951 Menopausal and female climacteric states: Secondary | ICD-10-CM | POA: Diagnosis not present

## 2023-04-05 ENCOUNTER — Other Ambulatory Visit: Payer: Self-pay | Admitting: Internal Medicine

## 2023-04-05 DIAGNOSIS — R928 Other abnormal and inconclusive findings on diagnostic imaging of breast: Secondary | ICD-10-CM

## 2023-05-02 ENCOUNTER — Ambulatory Visit
Admission: RE | Admit: 2023-05-02 | Discharge: 2023-05-02 | Disposition: A | Discharge status: Home / Self Care | Payer: 59 | Source: Ambulatory Visit | Attending: Internal Medicine | Admitting: Internal Medicine

## 2023-05-02 ENCOUNTER — Ambulatory Visit
Admission: RE | Admit: 2023-05-02 | Discharge: 2023-05-02 | Disposition: A | Discharge status: Home / Self Care | Payer: Self-pay | Source: Ambulatory Visit | Attending: Internal Medicine | Admitting: Internal Medicine

## 2023-05-02 ENCOUNTER — Other Ambulatory Visit: Payer: Self-pay | Admitting: Internal Medicine

## 2023-05-02 DIAGNOSIS — R928 Other abnormal and inconclusive findings on diagnostic imaging of breast: Secondary | ICD-10-CM

## 2023-05-02 DIAGNOSIS — R921 Mammographic calcification found on diagnostic imaging of breast: Secondary | ICD-10-CM | POA: Diagnosis not present

## 2023-05-02 DIAGNOSIS — N6311 Unspecified lump in the right breast, upper outer quadrant: Secondary | ICD-10-CM | POA: Diagnosis not present

## 2023-05-02 DIAGNOSIS — N6310 Unspecified lump in the right breast, unspecified quadrant: Secondary | ICD-10-CM

## 2023-05-10 ENCOUNTER — Ambulatory Visit
Admission: RE | Admit: 2023-05-10 | Discharge: 2023-05-10 | Disposition: A | Discharge status: Home / Self Care | Payer: 59 | Source: Ambulatory Visit | Attending: Internal Medicine | Admitting: Internal Medicine

## 2023-05-10 DIAGNOSIS — N6310 Unspecified lump in the right breast, unspecified quadrant: Secondary | ICD-10-CM

## 2023-05-10 DIAGNOSIS — N6311 Unspecified lump in the right breast, upper outer quadrant: Secondary | ICD-10-CM | POA: Diagnosis not present

## 2023-05-10 DIAGNOSIS — N6011 Diffuse cystic mastopathy of right breast: Secondary | ICD-10-CM | POA: Diagnosis not present

## 2023-05-10 HISTORY — PX: BREAST BIOPSY: SHX20

## 2023-05-11 LAB — SURGICAL PATHOLOGY

## 2023-10-17 ENCOUNTER — Encounter: Payer: 59 | Admitting: Nurse Practitioner

## 2024-04-03 ENCOUNTER — Other Ambulatory Visit: Payer: Self-pay | Admitting: Obstetrics and Gynecology

## 2024-04-03 DIAGNOSIS — N6099 Unspecified benign mammary dysplasia of unspecified breast: Secondary | ICD-10-CM

## 2024-05-02 ENCOUNTER — Ambulatory Visit
Admission: RE | Admit: 2024-05-02 | Discharge: 2024-05-02 | Disposition: A | Discharge status: Home / Self Care | Source: Ambulatory Visit | Attending: Obstetrics and Gynecology | Admitting: Obstetrics and Gynecology

## 2024-05-02 ENCOUNTER — Other Ambulatory Visit: Payer: Self-pay | Admitting: Obstetrics and Gynecology

## 2024-05-02 DIAGNOSIS — N6099 Unspecified benign mammary dysplasia of unspecified breast: Secondary | ICD-10-CM

## 2024-05-09 ENCOUNTER — Encounter: Payer: Self-pay | Admitting: Physician Assistant
